# Patient Record
Sex: Male | Born: 1935 | Race: White | Hispanic: No | Marital: Married | Smoking: Former smoker
Health system: Southern US, Community
[De-identification: ages and names within clinical notes are randomized; demographics above are authoritative.]

## PROBLEM LIST (undated history)

## (undated) DIAGNOSIS — F0390 Unspecified dementia without behavioral disturbance: Secondary | ICD-10-CM

## (undated) DIAGNOSIS — T7840XA Allergy, unspecified, initial encounter: Secondary | ICD-10-CM

## (undated) DIAGNOSIS — I509 Heart failure, unspecified: Secondary | ICD-10-CM

## (undated) DIAGNOSIS — N189 Chronic kidney disease, unspecified: Secondary | ICD-10-CM

## (undated) DIAGNOSIS — C801 Malignant (primary) neoplasm, unspecified: Secondary | ICD-10-CM

## (undated) DIAGNOSIS — M539 Dorsopathy, unspecified: Secondary | ICD-10-CM

## (undated) DIAGNOSIS — M2550 Pain in unspecified joint: Secondary | ICD-10-CM

## (undated) HISTORY — DX: Dorsopathy, unspecified: M53.9

## (undated) HISTORY — DX: Heart failure, unspecified: I50.9

## (undated) HISTORY — DX: Chronic kidney disease, unspecified: N18.9

## (undated) HISTORY — DX: Malignant (primary) neoplasm, unspecified: C80.1

## (undated) HISTORY — PX: HERNIA REPAIR: SHX51

## (undated) HISTORY — DX: Allergy, unspecified, initial encounter: T78.40XA

## (undated) HISTORY — DX: Pain in unspecified joint: M25.50

## (undated) HISTORY — PX: BACK SURGERY: SHX140

## (undated) HISTORY — PX: KNEE SURGERY: SHX244

---

## 1997-12-01 ENCOUNTER — Emergency Department (HOSPITAL_COMMUNITY): Admission: EM | Admit: 1997-12-01 | Discharge: 1997-12-01 | Payer: Self-pay | Admitting: Internal Medicine

## 2005-04-22 ENCOUNTER — Emergency Department: Payer: Self-pay | Admitting: Emergency Medicine

## 2005-05-01 ENCOUNTER — Emergency Department: Payer: Self-pay | Admitting: Emergency Medicine

## 2005-08-07 ENCOUNTER — Emergency Department: Payer: Self-pay | Admitting: Emergency Medicine

## 2009-02-12 ENCOUNTER — Ambulatory Visit: Payer: Self-pay | Admitting: Family

## 2009-02-20 ENCOUNTER — Ambulatory Visit: Payer: Self-pay | Admitting: Family

## 2009-05-13 DEATH — deceased

## 2009-05-26 ENCOUNTER — Ambulatory Visit: Payer: Self-pay | Admitting: Family

## 2009-07-07 ENCOUNTER — Ambulatory Visit: Payer: Self-pay | Admitting: Family

## 2009-12-14 ENCOUNTER — Emergency Department: Payer: Self-pay | Admitting: Emergency Medicine

## 2011-06-14 HISTORY — PX: EXTERNAL EAR SURGERY: SHX627

## 2014-06-02 ENCOUNTER — Emergency Department: Payer: Self-pay | Admitting: Emergency Medicine

## 2014-06-18 ENCOUNTER — Ambulatory Visit (INDEPENDENT_AMBULATORY_CARE_PROVIDER_SITE_OTHER): Payer: Medicare Other | Admitting: General Surgery

## 2014-06-18 ENCOUNTER — Encounter: Payer: Self-pay | Admitting: General Surgery

## 2014-06-18 VITALS — BP 120/86 | HR 86 | Resp 16 | Ht 68.0 in | Wt 158.0 lb

## 2014-06-18 DIAGNOSIS — K4090 Unilateral inguinal hernia, without obstruction or gangrene, not specified as recurrent: Secondary | ICD-10-CM

## 2014-06-18 NOTE — Progress Notes (Signed)
Patient ID: Georgiana Spinner, male   DOB: 1936/04/30, 79 y.o.   MRN: [id number]  Chief Complaint  Patient presents with  . Hernia    HPI RADFORD PEASE Sr is a 79 y.o. male  Here today for a evaluation of a right inguinal hernia.Patient noticed a "knot" right inguinal area about mid November. It went away then 06-03-14 he noticed it again and went to the ED. They were able to "massage it back in". He is having some pain in that area especially when he coughs. He has had both inguinal hernias repaired in the past. Bowels move about every 2 days, he uses a stool softener occasionally for constipation. He is here today with his wife and she does state he is having some memory issues that seem to be worse over the past couple of years. He is hard of hearing.  HPI  Past Medical History  Diagnosis Date  . CHF (congestive heart failure)   . Allergy   . Chronic kidney disease   . Back problem   . Joint ache   . 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 Back surgery    . Knee surgery    . Hernia repair      left inguinal x 2 right inguinal x 1  . External ear surgery Left 2013    cancer    No family history on file.  Social History History  Substance Use Topics  . Smoking status: Former Smoker -- 1.00 packs/day for 40 years    Types: Cigarettes    Quit date: 06/13/1973  . Smokeless tobacco: Never Used  . Alcohol Use: No    Allergies  Allergen Reactions  . Aspirin   . Bee Pollen     Bee stings  . Darvon [Propoxyphene] Other (See Comments)    hallucinations  . Epinephrine Other (See Comments)    hallucinate  . Penicillins Swelling  . Sulfa Antibiotics Swelling  . Xylocaine [Lidocaine Hcl] Other (See Comments)    hallucinate    Current Outpatient Prescriptions  Medication Sig Dispense Refill  . cetirizine (ZYRTEC) 10 MG tablet Take 10 mg by mouth daily.    Mariane Baumgarten Calcium (STOOL SOFTENER PO) Take by mouth as  needed.    . fluticasone (FLOVENT HFA) 110 MCG/ACT inhaler Inhale 1 puff into the lungs 2 (two) times daily.    Marland Kitchen ibuprofen (ADVIL,MOTRIN) 200 MG tablet Take 200 mg by mouth every 6 (six) hours as needed.     No current facility-administered medications for this visit.    Review of Systems Review of Systems  Constitutional: Negative.   Respiratory: Negative.   Cardiovascular: Negative.   Gastrointestinal: Positive for constipation.    Blood pressure 120/86, pulse 86, resp. rate 16, height 5\' 8"  (1.727 m), weight 158 lb (71.668 kg).  Physical Exam Physical Exam  Constitutional: He appears well-developed and well-nourished.  Neck: Neck supple.  Cardiovascular: Normal rate, regular rhythm and normal heart sounds.   Pulmonary/Chest: Effort normal and breath sounds normal.  Abdominal: Soft. Normal appearance. There is no tenderness. A hernia is present. Hernia confirmed positive in the right inguinal area.    Buldge right inguinal area  Lymphadenopathy:    He has no cervical adenopathy.  Neurological: He is alert.  Skin: Skin is warm and dry.    Data Reviewed Emergency department notes reports reduction of right inguinal hernia.06/03/2014.  Assessment  Right inguinal hernia with recent episode of transient incarceration.     Plan    Elective hernia repair is appropriate.  Hernia precautions and incarceration were discussed with the patient. If they develop symptoms of an incarcerated hernia, they were encouraged to seek prompt medical attention.  I have recommended repair of the hernia using mesh on an outpatient basis in the near future. The risk of infection was reviewed. The role of prosthetic mesh to minimize the risk of recurrence was reviewed.    Patient's surgery has been scheduled for 07-09-14 at [facility].   PCP: none REF : Mardee Postin PA  Robert Bellow 06/20/2014, 7:10 AM

## 2014-06-18 NOTE — Patient Instructions (Addendum)

## 2014-06-20 ENCOUNTER — Other Ambulatory Visit: Payer: Self-pay | Admitting: General Surgery

## 2014-06-20 DIAGNOSIS — K4090 Unilateral inguinal hernia, without obstruction or gangrene, not specified as recurrent: Secondary | ICD-10-CM | POA: Insufficient documentation

## 2014-06-30 ENCOUNTER — Encounter: Payer: Self-pay | Admitting: General Surgery

## 2014-06-30 ENCOUNTER — Ambulatory Visit: Payer: Self-pay | Admitting: General Surgery

## 2014-06-30 LAB — CBC WITH DIFFERENTIAL/PLATELET
Basophil #: 0 10*3/uL (ref 0.0–0.1)
Basophil %: 0.7 %
EOS ABS: 0.2 10*3/uL (ref 0.0–0.7)
EOS PCT: 4.2 %
HCT: 45.4 % (ref 40.0–52.0)
HGB: 15.2 g/dL (ref 13.0–18.0)
LYMPHS PCT: 24.4 %
Lymphocyte #: 1.4 10*3/uL (ref 1.0–3.6)
MCH: 33.5 pg (ref 26.0–34.0)
MCHC: 33.5 g/dL (ref 32.0–36.0)
MCV: 100 fL (ref 80–100)
Monocyte #: 0.5 x10 3/mm (ref 0.2–1.0)
Monocyte %: 8 %
NEUTROS PCT: 62.7 %
Neutrophil #: 3.5 10*3/uL (ref 1.4–6.5)
PLATELETS: 152 10*3/uL (ref 150–440)
RBC: 4.55 10*6/uL (ref 4.40–5.90)
RDW: 13.6 % (ref 11.5–14.5)
WBC: 5.7 10*3/uL (ref 3.8–10.6)

## 2014-06-30 LAB — BASIC METABOLIC PANEL
Anion Gap: 8 (ref 7–16)
BUN: 17 mg/dL (ref 7–18)
CALCIUM: 8.4 mg/dL — AB (ref 8.5–10.1)
CO2: 28 mmol/L (ref 21–32)
Chloride: 104 mmol/L (ref 98–107)
Creatinine: 1.06 mg/dL (ref 0.60–1.30)
EGFR (African American): >60
EGFR (Non-African Amer.): >60
Glucose: 84 mg/dL (ref 65–99)
Osmolality: 280 (ref 275–301)
Potassium: 3.7 mmol/L (ref 3.5–5.1)
Sodium: 140 mmol/L (ref 136–145)

## 2014-07-09 ENCOUNTER — Ambulatory Visit: Payer: Self-pay | Admitting: General Surgery

## 2014-07-09 DIAGNOSIS — K4091 Unilateral inguinal hernia, without obstruction or gangrene, recurrent: Secondary | ICD-10-CM

## 2014-07-09 HISTORY — PX: HERNIA REPAIR: SHX51

## 2014-07-10 ENCOUNTER — Encounter: Payer: Self-pay | Admitting: General Surgery

## 2014-07-16 ENCOUNTER — Encounter: Payer: Self-pay | Admitting: General Surgery

## 2014-07-16 ENCOUNTER — Ambulatory Visit (INDEPENDENT_AMBULATORY_CARE_PROVIDER_SITE_OTHER): Payer: Self-pay | Admitting: General Surgery

## 2014-07-16 VITALS — BP 128/68 | HR 74 | Resp 14 | Ht 68.0 in | Wt 157.0 lb

## 2014-07-16 DIAGNOSIS — K4090 Unilateral inguinal hernia, without obstruction or gangrene, not specified as recurrent: Secondary | ICD-10-CM

## 2014-07-16 NOTE — Patient Instructions (Signed)
Patient to to return in one month.

## 2014-07-16 NOTE — Progress Notes (Signed)
Patient ID: Jaime Liner Sr., male   DOB: 02-Jul-1935, 78 y.o.   MRN: [id number]  Chief Complaint  Patient presents with  . Routine Post Op    right inguinal hernia    HPI Jaime Villegas. is a 79 y.o. male here today for his post op right inguinal hernia done on 07/09/14.Patient states he is doing well. Patient states when he moves his bowels, he saw blood on the paper not in the bowel. He states this morning he felt light headed, but this resolved. HPI  Past Medical History  Diagnosis Date  . CHF (congestive heart failure)   . Allergy   . Chronic kidney disease   . Back problem   . Joint ache   . 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 Back surgery    . Knee surgery    . External ear surgery Left 2013    cancer  . Hernia repair      left inguinal x 2 right inguinal x 1  . Hernia repair Right 07/09/14    Bard onlay polypropylene mesh for recurrent hernia repair    No family history on file.  Social History History  Substance Use Topics  . Smoking status: Former Smoker -- 1.00 packs/day for 40 years    Types: Cigarettes    Quit date: 06/13/1973  . Smokeless tobacco: Never Used  . Alcohol Use: No    Allergies  Allergen Reactions  . Aspirin   . Bee Pollen     Bee stings  . Darvon [Propoxyphene] Other (See Comments)    hallucinations  . Epinephrine Other (See Comments)    hallucinate  . Penicillins Swelling  . Sulfa Antibiotics Swelling  . Xylocaine [Lidocaine Hcl] Other (See Comments)    hallucinate    Current Outpatient Prescriptions  Medication Sig Dispense Refill  . cetirizine (ZYRTEC) 10 MG tablet Take 10 mg by mouth daily.    Mariane Baumgarten Calcium (STOOL SOFTENER PO) Take by mouth as needed.    . fluticasone (FLOVENT HFA) 110 MCG/ACT inhaler Inhale 1 puff into the lungs 2 (two) times daily.    Marland Kitchen ibuprofen (ADVIL,MOTRIN) 200 MG tablet Take 200 mg by mouth every 6 (six) hours as needed.     No  current facility-administered medications for this visit.    Review of Systems Review of Systems  Constitutional: Negative.   Respiratory: Negative.   Cardiovascular: Negative.   Gastrointestinal: Positive for constipation.    Blood pressure 128/68, pulse 74, resp. rate 14, height 5\' 8"  (1.727 m), weight 157 lb (71.215 kg).  Physical Exam Physical Exam  Constitutional: He is oriented to person, place, and time. He appears well-developed and well-nourished.  Eyes: Conjunctivae are normal. No scleral icterus.  Neck: Neck supple.  Genitourinary:     Lymphadenopathy:    He has no cervical adenopathy.  Neurological: He is alert and oriented to person, place, and time.  Skin: Skin is warm and dry.   .     Assessment    Doing well status post    Plan    Patient to return in one month.  The patient was asked to apply Neosporin ointment to the area of erythema until it resolves.       Jaime Villegas 07/17/2014, 5:11 PM

## 2014-07-17 ENCOUNTER — Encounter: Payer: Self-pay | Admitting: General Surgery

## 2014-08-13 ENCOUNTER — Encounter: Payer: Self-pay | Admitting: General Surgery

## 2014-08-13 ENCOUNTER — Ambulatory Visit (INDEPENDENT_AMBULATORY_CARE_PROVIDER_SITE_OTHER): Payer: Medicare Other | Admitting: General Surgery

## 2014-08-13 VITALS — BP 118/78 | HR 78 | Resp 14 | Ht 68.0 in | Wt 158.0 lb

## 2014-08-13 DIAGNOSIS — K4090 Unilateral inguinal hernia, without obstruction or gangrene, not specified as recurrent: Secondary | ICD-10-CM

## 2014-08-13 NOTE — Patient Instructions (Signed)
The patient is aware to call back for any questions or concerns.  

## 2014-08-13 NOTE — Progress Notes (Signed)
Patient ID: Jaime Liner Sr., male   DOB: 1936-01-23, 79 y.o.   MRN: [id number]  Chief Complaint  Patient presents with  . Routine Post Op    hernia    HPI Jaime PENSE Sr. is a 79 y.o. male.  Here today for postoperative visit, hernia repair 07-08-14. States he is doing well. Using ibuprofen as needed for pain. He is using a stool softener and bowels move daily.  HPI  Past Medical History  Diagnosis Date  . CHF (congestive heart failure)   . Allergy   . Chronic kidney disease   . Back problem   . Joint ache   . 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 Back surgery    . Knee surgery    . External ear surgery Left 2013    cancer  . Hernia repair      left inguinal x 2 right inguinal x 1  . Hernia repair Right 07/09/14    Bard onlay polypropylene mesh for recurrent hernia repair    No family history on file.  Social History History  Substance Use Topics  . Smoking status: Former Smoker -- 1.00 packs/day for 40 years    Types: Cigarettes    Quit date: 06/13/1973  . Smokeless tobacco: Never Used  . Alcohol Use: No    Allergies  Allergen Reactions  . Aspirin   . Bee Pollen     Bee stings  . Darvon [Propoxyphene] Other (See Comments)    hallucinations  . Epinephrine Other (See Comments)    hallucinate  . Penicillins Swelling  . Sulfa Antibiotics Swelling  . Xylocaine [Lidocaine Hcl] Other (See Comments)    hallucinate    Current Outpatient Prescriptions  Medication Sig Dispense Refill  . cetirizine (ZYRTEC) 10 MG tablet Take 10 mg by mouth daily.    Jaime Villegas Calcium (STOOL SOFTENER PO) Take by mouth as needed.    . fluticasone (FLOVENT HFA) 110 MCG/ACT inhaler Inhale 1 puff into the lungs 2 (two) times daily.    Marland Kitchen ibuprofen (ADVIL,MOTRIN) 200 MG tablet Take 200 mg by mouth every 6 (six) hours as needed.     No current facility-administered medications for this visit.    Review of Systems Review  of Systems  Constitutional: Negative.   Respiratory: Negative.   Cardiovascular: Negative.     Blood pressure 118/78, pulse 78, resp. rate 14, height 5\' 8"  (1.727 m), weight 158 lb (71.668 kg).  Physical Exam Physical Exam  Constitutional: He is oriented to person, place, and time. He appears well-developed and well-nourished.  Abdominal:    Genitourinary:  Incision healing well.  Neurological: He is alert and oriented to person, place, and time.  Skin: Skin is warm and dry.       Assessment    Doing well status post right inguinal hernia repair.    Plan    The patient was encouraged to use good judgment with strenuous physical activity. Proper lifting technique was described and demonstrated.    Follow up as needed.  PCP:  No PCP REF : Jaime Postin PA   Robert Bellow 08/14/2014, 8:26 PM

## 2014-10-12 NOTE — Op Note (Signed)
PATIENT NAME:  Jaime Villegas, Jaime Villegas MR#:  [id number] DATE OF BIRTH:  [birth year]  DATE OF PROCEDURE:  07/09/2014  PREOPERATIVE DIAGNOSIS:  Recurrent right inguinal hernia.   POSTOPERATIVE DIAGNOSIS:  Recurrent right inguinal hernia.  OPERATIVE PROCEDURE:  Repair of recurrent right indirect inguinal hernia with Bard polypropylene mesh.   SURGEON:  Robert Bellow, MD  ANESTHESIA:  General by LMA under Gjibertus F. Boston Service, MD, Marcaine 0.5% plain with 30 mL local infiltration.   ESTIMATED BLOOD LOSS:  Less than 5 mL.   CLINICAL NOTE:  This 79 year old male underwent an inguinal hernia repair in the distant past. He has developed a recurrence. He presented to the Emergency Department last month with an episode of incarceration that was reduced by the Emergency Room staff. He is brought to the operating room at this time for planned elective repair.   The patient received Kefzol prior to the procedure. Hair was clipped from the surgical site prior to being brought to the operating room.   OPERATIVE NOTE:  With the patient under adequate general anesthesia, the abdomen was prepped with ChloraPrep and draped. A 5 cm incision along the anticipated course of the inguinal canal was made in the right lower quadrant. Hemostasis was with electrocautery. The external oblique was opened in the direction of its fibers. Moderate scarring from previous surgery was noted. The cord was identified and mobilized. There was an indirect sac that was scarred. This was dissected at the level of the internal inguinal ring. The sac was opened and found to be patent with gentle probing. The surprising portion of this discovery was that the sac was essentially superior and lateral to the cord rather than superior and medial. The entire cord was dissected and no additional hernia sacs were appreciated. A lipoma of the cord was excised and discarded. The floor of the inguinal canal was examined and no medial weakness  appreciated. The hernia sac was transfixed with a 3-0 Vicryl suture ligature followed by a 3-0 Vicryl tie, excised, and discarded. This was done under direct visualization. The cord was again dissected and no additional defects noted. A Bard polypropylene lightweight mesh was brought into the field and anchored to the pubic tubercle with 0 Surgilon. It was then anchored to the inguinal ligament with interrupted 0 Surgilon sutures. The lateral slit was closed with 0 Surgilon sutures. The medial and superior borders were anchored to the transverse abdominis aponeurosis. Care was taken to avoid the ilioinguinal and iliohypogastric nerves. The external oblique was closed with a running 2-0 Vicryl. Scarpa fascia was closed with a running 3-0 Vicryl and the skin closed with a running 4-0 Vicryl subcuticular suture. Benzoin, Steri-Strips, Telfa, and Tegaderm dressing was then applied.   The patient tolerated the procedure well.    ____________________________ Robert Bellow, MD jwb:nb D: 07/09/2014 14:39:50 ET T: 07/09/2014 22:03:06 ET JOB#: [id number]  cc: Robert Bellow, MD, <Dictator> JEFFREY Amedeo Kinsman MD ELECTRONICALLY SIGNED 07/10/2014 8:26

## 2018-09-24 ENCOUNTER — Emergency Department
Admission: EM | Admit: 2018-09-24 | Discharge: 2018-09-25 | Disposition: A | Discharge status: Home / Self Care | Payer: Medicare Other | Attending: Student in an Organized Health Care Education/Training Program | Admitting: Student in an Organized Health Care Education/Training Program

## 2018-09-24 ENCOUNTER — Other Ambulatory Visit: Payer: Self-pay

## 2018-09-24 ENCOUNTER — Emergency Department: Payer: Medicare Other

## 2018-09-24 ENCOUNTER — Encounter: Payer: Self-pay | Admitting: Emergency Medicine

## 2018-09-24 DIAGNOSIS — Z87891 Personal history of nicotine dependence: Secondary | ICD-10-CM | POA: Diagnosis not present

## 2018-09-24 DIAGNOSIS — Z7982 Long term (current) use of aspirin: Secondary | ICD-10-CM | POA: Insufficient documentation

## 2018-09-24 DIAGNOSIS — F0390 Unspecified dementia without behavioral disturbance: Secondary | ICD-10-CM | POA: Diagnosis not present

## 2018-09-24 DIAGNOSIS — I130 Hypertensive heart and chronic kidney disease with heart failure and stage 1 through stage 4 chronic kidney disease, or unspecified chronic kidney disease: Secondary | ICD-10-CM | POA: Diagnosis not present

## 2018-09-24 DIAGNOSIS — F0280 Dementia in other diseases classified elsewhere without behavioral disturbance: Secondary | ICD-10-CM | POA: Insufficient documentation

## 2018-09-24 DIAGNOSIS — G301 Alzheimer's disease with late onset: Secondary | ICD-10-CM | POA: Diagnosis not present

## 2018-09-24 DIAGNOSIS — I509 Heart failure, unspecified: Secondary | ICD-10-CM | POA: Diagnosis not present

## 2018-09-24 DIAGNOSIS — G309 Alzheimer's disease, unspecified: Secondary | ICD-10-CM | POA: Diagnosis not present

## 2018-09-24 DIAGNOSIS — R4182 Altered mental status, unspecified: Secondary | ICD-10-CM | POA: Diagnosis present

## 2018-09-24 DIAGNOSIS — Z79899 Other long term (current) drug therapy: Secondary | ICD-10-CM | POA: Insufficient documentation

## 2018-09-24 DIAGNOSIS — N189 Chronic kidney disease, unspecified: Secondary | ICD-10-CM | POA: Diagnosis not present

## 2018-09-24 DIAGNOSIS — F0391 Unspecified dementia with behavioral disturbance: Secondary | ICD-10-CM

## 2018-09-24 HISTORY — DX: Unspecified dementia, unspecified severity, without behavioral disturbance, psychotic disturbance, mood disturbance, and anxiety: F03.90

## 2018-09-24 LAB — COMPREHENSIVE METABOLIC PANEL
ALT: 10 U/L (ref 0–44)
AST: 22 U/L (ref 15–41)
Albumin: 3.9 g/dL (ref 3.5–5.0)
Alkaline Phosphatase: 31 U/L — ABNORMAL LOW (ref 38–126)
Anion gap: 13 (ref 5–15)
BUN: 18 mg/dL (ref 8–23)
CO2: 27 mmol/L (ref 22–32)
Calcium: 9 mg/dL (ref 8.9–10.3)
Chloride: 100 mmol/L (ref 98–111)
Creatinine, Ser: 1.1 mg/dL (ref 0.61–1.24)
GFR calc Af Amer: >60 mL/min (ref >60)
GFR calc non Af Amer: >60 mL/min (ref >60)
Glucose, Bld: 96 mg/dL (ref 70–99)
Potassium: 3.5 mmol/L (ref 3.5–5.1)
Sodium: 140 mmol/L (ref 135–145)
Total Bilirubin: 1.2 mg/dL (ref 0.3–1.2)
Total Protein: 7.1 g/dL (ref 6.5–8.1)

## 2018-09-24 LAB — CBC
HCT: 40.7 % (ref 39.0–52.0)
Hemoglobin: 14 g/dL (ref 13.0–17.0)
MCH: 32.3 pg (ref 26.0–34.0)
MCHC: 34.4 g/dL (ref 30.0–36.0)
MCV: 93.8 fL (ref 80.0–100.0)
Platelets: 154 10*3/uL (ref 150–400)
RBC: 4.34 MIL/uL (ref 4.22–5.81)
RDW: 12.1 % (ref 11.5–15.5)
WBC: 5.2 10*3/uL (ref 4.0–10.5)
nRBC: 0 % (ref 0.0–0.2)

## 2018-09-24 LAB — TROPONIN I: Troponin I: <0.03 ng/mL (ref <0.03)

## 2018-09-24 MED ORDER — GABAPENTIN 100 MG PO CAPS
100.0000 mg | ORAL_CAPSULE | ORAL | Status: DC
  Filled 2018-09-24: qty 1

## 2018-09-24 MED ORDER — DIVALPROEX SODIUM 125 MG PO DR TAB
125.0000 mg | DELAYED_RELEASE_TABLET | Freq: Two times a day (BID) | ORAL | Status: DC
  Administered 2018-09-24 – 2018-09-25 (×2): 125 mg via ORAL
  Filled 2018-09-24 (×4): qty 1

## 2018-09-24 MED ORDER — TRAMADOL HCL 50 MG PO TABS: 50.0000 mg | ORAL_TABLET | Freq: Two times a day (BID) | ORAL | Status: DC

## 2018-09-24 MED ORDER — LORAZEPAM 1 MG PO TABS
1.0000 mg | ORAL_TABLET | Freq: Once | ORAL | Status: AC
  Administered 2018-09-24: 22:00:00 1 mg via ORAL
  Filled 2018-09-24: qty 1

## 2018-09-24 MED ORDER — ZIPRASIDONE MESYLATE 20 MG IM SOLR
20.0000 mg | Freq: Once | INTRAMUSCULAR | Status: AC
  Administered 2018-09-24: 20 mg via INTRAMUSCULAR
  Filled 2018-09-24: qty 20

## 2018-09-24 MED ORDER — MEMANTINE HCL 5 MG PO TABS
10.0000 mg | ORAL_TABLET | Freq: Two times a day (BID) | ORAL | Status: DC
  Administered 2018-09-24 – 2018-09-25 (×2): 10 mg via ORAL
  Filled 2018-09-24 (×2): qty 2

## 2018-09-24 MED ORDER — GABAPENTIN 400 MG PO CAPS
400.0000 mg | ORAL_CAPSULE | Freq: Every day | ORAL | Status: DC
  Administered 2018-09-24: 22:00:00 400 mg via ORAL
  Filled 2018-09-24 (×2): qty 1

## 2018-09-24 MED ORDER — ASPIRIN EC 81 MG PO TBEC
81.0000 mg | DELAYED_RELEASE_TABLET | ORAL | Status: DC
  Administered 2018-09-24: 81 mg via ORAL
  Filled 2018-09-24: qty 1

## 2018-09-24 MED ORDER — MIRTAZAPINE 15 MG PO TABS
15.0000 mg | ORAL_TABLET | Freq: Every day | ORAL | Status: DC
  Administered 2018-09-24: 22:00:00 15 mg via ORAL
  Filled 2018-09-24: qty 1

## 2018-09-24 MED ORDER — SODIUM CHLORIDE 0.9% FLUSH
3.0000 mL | Freq: Once | INTRAVENOUS | Status: AC
  Administered 2018-09-24: 18:00:00 3 mL via INTRAVENOUS

## 2018-09-24 MED ORDER — SODIUM CHLORIDE 0.9 % IV BOLUS
500.0000 mL | Freq: Once | INTRAVENOUS | Status: AC
  Administered 2018-09-24: 19:00:00 500 mL via INTRAVENOUS

## 2018-09-24 NOTE — ED Notes (Signed)
Moved onto hospital bed with alarm

## 2018-09-24 NOTE — ED Notes (Signed)
Patient transported to CT 

## 2018-09-24 NOTE — ED Provider Notes (Addendum)
[facility] Emergency Department Provider Note    First MD Initiated Contact with Patient 09/24/18 1750     (approximate)  I have reviewed the triage vital signs and the nursing notes.   HISTORY  Chief Complaint Altered Mental Status and Failure To Thrive  Level V Caveat:  dementia  HPI Jaime MCGANN Sr. is a 83 y.o. male presents the ER via family after being dropped off because "we cannot take care of him.  ".  Patient refusing to eat or drink or take medications for only 24 hours.  Patient intermittently yelling out "time to go ".  Denies any pain.  No nausea or vomiting.  He is pleasant no acute distress.    Past Medical History:  Diagnosis Date  . Allergy   . Back problem   . Cancer ([place]) 2006, 2013   nose, left ear  . CHF (congestive heart failure) ([place])   . Chronic kidney disease   . Dementia ([place])   . Joint ache    No family history on file. Past Surgical History:  Procedure Laterality Date  . BACK SURGERY    . EXTERNAL EAR SURGERY Left 2013   cancer  . HERNIA REPAIR     left inguinal x 2 right inguinal x 1  . HERNIA REPAIR Right 07/09/14   Bard onlay polypropylene mesh for recurrent hernia repair  . KNEE SURGERY     Patient Active Problem List   Diagnosis Date Noted  . Right inguinal hernia 06/20/2014      Prior to Admission medications   Medication Sig Start Date End Date Taking? Authorizing Provider  aspirin EC 81 MG tablet Take 81 mg by mouth 2 (two) times a week.   Yes [provider]  cetirizine (ZYRTEC) 10 MG tablet Take 10 mg by mouth daily.   Yes [provider]  divalproex (DEPAKOTE) 125 MG DR tablet Take 125 mg by mouth 2 (two) times daily. 09/18/18  Yes [provider]  gabapentin (NEURONTIN) 100 MG capsule Take 100 mg by mouth every morning. 08/06/18  Yes [provider]  gabapentin (NEURONTIN) 400 MG capsule Take 400 mg by mouth Nightly. 08/06/18  Yes [provider]   mirtazapine (REMERON) 15 MG tablet Take 15 mg by mouth at bedtime. 09/18/18  Yes [provider]  vitamin B-12 (CYANOCOBALAMIN) 1000 MCG tablet Take 1,000 mcg by mouth daily.   Yes [provider]  Docusate Calcium (STOOL SOFTENER PO) Take by mouth as needed.    [provider]  fluticasone (FLOVENT HFA) 110 MCG/ACT inhaler Inhale 1 puff into the lungs 2 (two) times daily.    [provider]  ibuprofen (ADVIL,MOTRIN) 200 MG tablet Take 200 mg by mouth every 6 (six) hours as needed.    [provider]  memantine (NAMENDA) 10 MG tablet Take 10 mg by mouth 2 (two) times daily. 07/31/18   [provider]  traMADol (ULTRAM) 50 MG tablet Take 50 mg by mouth 2 (two) times daily. 08/06/18   [provider]    Allergies Aspirin; Bee pollen; Darvon [propoxyphene]; Epinephrine; Penicillins; Sulfa antibiotics; and Xylocaine [lidocaine hcl]    Social History Social History   Tobacco Use  . Smoking status: Former Smoker    Packs/day: 1.00    Years: 40.00    Pack years: 40.00    Types: Cigarettes    Last attempt to quit: 06/13/1973    Years since quitting: 45.3  . Smokeless tobacco: Never  Used  Substance Use Topics  . Alcohol use: No    Alcohol/week: 0.0 standard drinks  . Drug use: No    Review of Systems Patient denies headaches, rhinorrhea, blurry vision, numbness, shortness of breath, chest pain, edema, cough, abdominal pain, nausea, vomiting, diarrhea, dysuria, fevers, rashes or hallucinations unless otherwise stated above in HPI. ____________________________________________   PHYSICAL EXAM:  VITAL SIGNS: Vitals:   09/24/18 1740  BP: 120/88  Pulse: (!) 103  Resp: 16  Temp: 97.6 F (36.4 C)  SpO2: 100%    Constitutional: Alert pleasant in no acute distress. Eyes: Conjunctivae are normal.  Head: Atraumatic. Nose: No congestion/rhinnorhea. Mouth/Throat: Mucous membranes are moist.   Neck: No stridor. Painless ROM.   Cardiovascular: Normal rate, regular rhythm. Grossly normal heart sounds.  Good peripheral circulation. Respiratory: Normal respiratory effort.  No retractions. Lungs CTAB. Gastrointestinal: Soft and nontender. No distention. No abdominal bruits. No CVA tenderness. Genitourinary:  Musculoskeletal: No lower extremity tenderness nor edema.  No joint effusions. Neurologic:   No gross focal neurologic deficits are appreciated. No facial droop Skin:  Skin is warm, dry and intact. No rash noted. Psychiatric: pleasant and calm but will intermittently yell out "time to go" ____________________________________________   LABS (all labs ordered are listed, but only abnormal results are displayed)  Results for orders placed or performed during the hospital encounter of 09/24/18 (from the past 24 hour(s))  Comprehensive metabolic panel     Status: Abnormal   Collection Time: 09/24/18  5:58 PM  Result Value Ref Range   Sodium 140 135 - 145 mmol/L   Potassium 3.5 3.5 - 5.1 mmol/L   Chloride 100 98 - 111 mmol/L   CO2 27 22 - 32 mmol/L   Glucose, Bld 96 70 - 99 mg/dL   BUN 18 8 - 23 mg/dL   Creatinine, Ser 1.10 0.61 - 1.24 mg/dL   Calcium 9.0 8.9 - 10.3 mg/dL   Total Protein 7.1 6.5 - 8.1 g/dL   Albumin 3.9 3.5 - 5.0 g/dL   AST 22 15 - 41 U/L   ALT 10 0 - 44 U/L   Alkaline Phosphatase 31 (L) 38 - 126 U/L   Total Bilirubin 1.2 0.3 - 1.2 mg/dL   GFR calc non Af Amer >60 >60 mL/min   GFR calc Af Amer >60 >60 mL/min   Anion gap 13 5 - 15  CBC     Status: None   Collection Time: 09/24/18  5:58 PM  Result Value Ref Range   WBC 5.2 4.0 - 10.5 K/uL   RBC 4.34 4.22 - 5.81 MIL/uL   Hemoglobin 14.0 13.0 - 17.0 g/dL   HCT 40.7 39.0 - 52.0 %   MCV 93.8 80.0 - 100.0 fL   MCH 32.3 26.0 - 34.0 pg   MCHC 34.4 30.0 - 36.0 g/dL   RDW 12.1 11.5 - 15.5 %   Platelets 154 150 - 400 K/uL   nRBC 0.0 0.0 - 0.2 %  Troponin I - ONCE - STAT     Status: None   Collection Time: 09/24/18  5:58 PM  Result Value Ref  Range   Troponin I <0.03 <0.03 ng/mL   ____________________________________________  EKG My review and personal interpretation at Time: 17:51   Indication: dementia  Rate: 90  Rhythm: sinus Axis: normal Other: normal intervals, frequent pac,  ____________________________________________  RADIOLOGY  I personally reviewed all radiographic images ordered to evaluate for the above acute complaints and reviewed radiology reports and findings.  These  findings were personally discussed with the patient.  Please see medical record for radiology report.  ____________________________________________   PROCEDURES  Procedure(s) performed:  Procedures    Critical Care performed: no ____________________________________________   INITIAL IMPRESSION / ASSESSMENT AND PLAN / ED COURSE  Pertinent labs & imaging results that were available during my care of the patient were reviewed by me and considered in my medical decision making (see chart for details).   DDX: Dehydration, dementia, sepsis, pna, uti, hypoglycemia, cva, drug effect, withdrawal,  Jaime LANDSTROM Sr. is a 83 y.o. who presents to the ED with symptoms as described above with advanced dementia dropped off at the ER by family they feel that they are unable to care for him any longer.  Patient well-appearing hemodynamically stable.  Blood work reassuring.  Will consult social work as well as psychiatry.     The patient was evaluated in Emergency Department today for the symptoms described in the history of present illness. He/she was evaluated in the context of the global COVID-19 pandemic, which necessitated consideration that the patient might be at risk for infection with the SARS-CoV-2 virus that causes COVID-19. Institutional protocols and algorithms that pertain to the evaluation of patients at risk for COVID-19 are in a state of rapid change based on information released by regulatory bodies including the CDC and federal and  state organizations. These policies and algorithms were followed during the patient's care in the ED.  As part of my medical decision making, I reviewed the following data within the [place] notes reviewed and incorporated, Labs reviewed, notes from prior ED visits and [place] Controlled Substance Database   ____________________________________________   FINAL CLINICAL IMPRESSION(S) / ED DIAGNOSES  Final diagnoses:  Dementia without behavioral disturbance, unspecified dementia type ([place])      NEW MEDICATIONS STARTED DURING THIS VISIT:  New Prescriptions   No medications on file     Note:  This document was prepared using Dragon voice recognition software and may include unintentional dictation errors.    Merlyn Lot, MD 09/24/18 Dorthula Perfect    Merlyn Lot, MD 09/24/18 [phone]

## 2018-09-24 NOTE — ED Triage Notes (Signed)
Patient's wife brought patient because patient has been refusing to eat since yesterday and has been refusing his medications yesterday and today.  Patient's wife states, "He has end stage dementia and we just can't take care of him at home anymore."

## 2018-09-24 NOTE — ED Notes (Signed)
Pt attempting to get out of bed x 2. 1st time to void and pt assisted to BR and did void. 2nd time to go home and I explained his wife had something to do and he would stay here tonight. He said ok and slid back up in bed. Room changed to #9 for better observation from nursing station. Bed alarm in place and worked x 2 to alert staff he was getting up

## 2018-09-24 NOTE — Consult Note (Signed)
[facility] Consult   Reason for Consult: Altered mental status and failure to thrive Referring Physician: Dr. Quentin Cornwall Patient Identification: Jaime Liner Sr. MRN:  [id number] Principal Diagnosis: <principal problem not specified> Diagnosis:  Active Problems:   Dementia ([place])   Total Time spent with patient: 1 hour  Subjective: "Time to go"  Jaime Liner Sr. is a 83 y.o. male patient presented to [facility] ED by his family voluntarily. The patient was seen face-to-face by this provider; chart reviewed and consulted with Dr.  Quentin Cornwall on 09/24/2018 due to the care of the patient. It was discussed with the provider that the patient does not meet criteria to be admitted to the inpatient psychiatric unit. Discussion continue by agreeing that the patient does need long-term care in a skilled nursing facility and social work consult will be placed. On evaluation the patient is alert, calm and cooperative, but unable to answer questions presented to him.  The only statement the patient makes is "time to go" and nothing else during his assessment. The patient does appear to be responding to internal stimuli. He is presenting with  delusional thinking. The patient is also presenting with some hallucinating behaviors.  Collateral was obtained by the patient's wife (Ms. Cheyenne Schumm [phone] or [phone]) who expresses concerns for the worsening of her husband dementia diagnosis.  Ms. Kope stated, "I cannot take care of him anymore."   She stated in an emotional voice "I knew that his disease was going to get worse, but I did not think it was going to be this fast."  She expressed that the patient saw his neurologist in January and he did discussed the progression of his dementia diagnosis.  She stated, that his doctor said the disease could get to the point where the patient becomes violent.  Ms. Bevens voice it has not happened yet.  She discussed, that he has begun  wandering.  She voiced that he was brought to the hospital due to him refusing to take his medication along with not eating.  She said that over the past 24 hours he would spit out his medication and refused to eat anything.  She continues to voice that "I have tried everything.  I just do not want your to think I am giving up on him."  "I know your can help me find somewhere that they are able to take care of him." This provider discussed with the patient's wife that social work consult will be ordered and someone from the social work team will contact her in the next few days.  The patient wife was in agreement and voiced the help would be of great value to her and her family.  She continued to express how grateful she is to get the assistance she needs.  Plan: the patient is a safety risk to himself due to the progression of his dementia diagnosis.  The patient currently is in need of an inpatient nursing facility in order to assist with stabilization and treatment in his care.  HPI: Per Dr. Quentin Cornwall: Jaime Liner Sr. is a 83 y.o. male presents the ER via family after being dropped off because "we cannot take care of him.  ".  Patient refusing to eat or drink or take medications for only 24 hours.  Patient intermittently yelling out "time to go ".  Denies any pain.  No nausea or vomiting.  He is pleasant no acute distress.  Past Psychiatric History:  Dementia  Risk to Self:  Yes Risk to Others:  Yes Prior Inpatient Therapy:  No Prior Outpatient Therapy:  No  Past Medical History:  Past Medical History:  Diagnosis Date  . Allergy   . Back problem   . Cancer ([place]) 2006, 2013   nose, left ear  . CHF (congestive heart failure) ([place])   . Chronic kidney disease   . Dementia ([place])   . Joint ache     Past Surgical History:  Procedure Laterality Date  . BACK SURGERY    . EXTERNAL EAR SURGERY Left 2013   cancer  . HERNIA REPAIR     left inguinal x 2 right inguinal x 1  . HERNIA REPAIR  Right 07/09/14   Bard onlay polypropylene mesh for recurrent hernia repair  . KNEE SURGERY     Family History: No family history on file. Family Psychiatric  History:  Social History:  Social History   Substance and Sexual Activity  Alcohol Use No  . Alcohol/week: 0.0 standard drinks     Social History   Substance and Sexual Activity  Drug Use No    Social History   Socioeconomic History  . Marital status: Married    Spouse name: Not on file  . Number of children: Not on file  . Years of education: Not on file  . Highest education level: Not on file  Occupational History  . Not on file  Social Needs  . Financial resource strain: Not on file  . Food insecurity:    Worry: Not on file    Inability: Not on file  . Transportation needs:    Medical: Not on file    Non-medical: Not on file  Tobacco Use  . Smoking status: Former Smoker    Packs/day: 1.00    Years: 40.00    Pack years: 40.00    Types: Cigarettes    Last attempt to quit: 06/13/1973    Years since quitting: 45.3  . Smokeless tobacco: Never Used  Substance and Sexual Activity  . Alcohol use: No    Alcohol/week: 0.0 standard drinks  . Drug use: No  . Sexual activity: Not on file  Lifestyle  . Physical activity:    Days per week: Not on file    Minutes per session: Not on file  . Stress: Not on file  Relationships  . Social connections:    Talks on phone: Not on file    Gets together: Not on file    Attends religious service: Not on file    Active member of club or organization: Not on file    Attends meetings of clubs or organizations: Not on file    Relationship status: Not on file  Other Topics Concern  . Not on file  Social History Narrative  . Not on file   Additional Social History:    Allergies:   Allergies  Allergen Reactions  . Aspirin   . Bee Pollen     Bee stings  . Darvon [Propoxyphene] Other (See Comments)    hallucinations  . Epinephrine Other (See Comments)    hallucinate   . Penicillins Swelling  . Sulfa Antibiotics Swelling  . Xylocaine [Lidocaine Hcl] Other (See Comments)    hallucinate    Labs:  Results for orders placed or performed during the hospital encounter of 09/24/18 (from the past 48 hour(s))  Comprehensive metabolic panel     Status: Abnormal   Collection Time: 09/24/18  5:58 PM  Result Value Ref  Range   Sodium 140 135 - 145 mmol/L   Potassium 3.5 3.5 - 5.1 mmol/L   Chloride 100 98 - 111 mmol/L   CO2 27 22 - 32 mmol/L   Glucose, Bld 96 70 - 99 mg/dL   BUN 18 8 - 23 mg/dL   Creatinine, Ser 1.10 0.61 - 1.24 mg/dL   Calcium 9.0 8.9 - 10.3 mg/dL   Total Protein 7.1 6.5 - 8.1 g/dL   Albumin 3.9 3.5 - 5.0 g/dL   AST 22 15 - 41 U/L   ALT 10 0 - 44 U/L   Alkaline Phosphatase 31 (L) 38 - 126 U/L   Total Bilirubin 1.2 0.3 - 1.2 mg/dL   GFR calc non Af Amer >60 >60 mL/min   GFR calc Af Amer >60 >60 mL/min   Anion gap 13 5 - 15    Comment: Performed at [facility], [address], [place] [address]  CBC     Status: None   Collection Time: 09/24/18  5:58 PM  Result Value Ref Range   WBC 5.2 4.0 - 10.5 K/uL   RBC 4.34 4.22 - 5.81 MIL/uL   Hemoglobin 14.0 13.0 - 17.0 g/dL   HCT 40.7 39.0 - 52.0 %   MCV 93.8 80.0 - 100.0 fL   MCH 32.3 26.0 - 34.0 pg   MCHC 34.4 30.0 - 36.0 g/dL   RDW 12.1 11.5 - 15.5 %   Platelets 154 150 - 400 K/uL   nRBC 0.0 0.0 - 0.2 %    Comment: Performed at [facility], [address] [place], [place] [address]  Troponin I - ONCE - STAT     Status: None   Collection Time: 09/24/18  5:58 PM  Result Value Ref Range   Troponin I <0.03 <0.03 ng/mL    Comment: Performed at [facility], [address] [place], [place] [address]    Current Facility-Administered Medications  Medication Dose Route Frequency Provider Last Rate Last Dose  . aspirin EC tablet 81 mg  81 mg Oral Once per day on Mon Thu Robinson, Patrick, MD      . divalproex (DEPAKOTE) DR tablet 125 mg  125 mg Oral  BID Merlyn Lot, MD      . Derrill Memo ON 09/25/2018] gabapentin (NEURONTIN) capsule 100 mg  100 mg Oral Particia Nearing, MD      . gabapentin (NEURONTIN) capsule 400 mg  400 mg Oral Nightly Merlyn Lot, MD      . LORazepam (ATIVAN) tablet 1 mg  1 mg Oral Once Merlyn Lot, MD      . memantine [facility]) tablet 10 mg  10 mg Oral BID Merlyn Lot, MD      . mirtazapine (REMERON) tablet 15 mg  15 mg Oral QHS Merlyn Lot, MD       Current Outpatient Medications  Medication Sig Dispense Refill  . aspirin EC 81 MG tablet Take 81 mg by mouth 2 (two) times a week.    . cetirizine (ZYRTEC) 10 MG tablet Take 10 mg by mouth daily.    . divalproex (DEPAKOTE) 125 MG DR tablet Take 125 mg by mouth 2 (two) times daily.    Marland Kitchen gabapentin (NEURONTIN) 100 MG capsule Take 100 mg by mouth every morning.    . gabapentin (NEURONTIN) 400 MG capsule Take 400 mg by mouth Nightly.    . mirtazapine (REMERON) 15 MG tablet Take 15 mg by mouth at bedtime.    . vitamin  B-12 (CYANOCOBALAMIN) 1000 MCG tablet Take 1,000 mcg by mouth daily.    Mariane Baumgarten Calcium (STOOL SOFTENER PO) Take by mouth as needed.    . fluticasone (FLOVENT HFA) 110 MCG/ACT inhaler Inhale 1 puff into the lungs 2 (two) times daily.    Marland Kitchen ibuprofen (ADVIL,MOTRIN) 200 MG tablet Take 200 mg by mouth every 6 (six) hours as needed.    . memantine (NAMENDA) 10 MG tablet Take 10 mg by mouth 2 (two) times daily.    . traMADol (ULTRAM) 50 MG tablet Take 50 mg by mouth 2 (two) times daily.      Musculoskeletal: Strength & Muscle Tone: decreased Gait & Station: unsteady Patient leans: Backward  Psychiatric Specialty Exam: Physical Exam  Nursing note and vitals reviewed. Constitutional: He appears well-developed and well-nourished.  HENT:  Head: Normocephalic and atraumatic.  Eyes: Pupils are equal, round, and reactive to light. Conjunctivae and EOM are normal.  Neck: Normal range of motion. Neck supple.  Cardiovascular:  Normal rate and regular rhythm.  Respiratory: Effort normal.  Musculoskeletal: Normal range of motion.  Neurological: He is alert.  Skin: Skin is warm and dry.  Psychiatric: He has a normal mood and affect.    Review of Systems  Neurological: Positive for loss of consciousness.  Psychiatric/Behavioral: Positive for hallucinations and memory loss. Negative for depression, substance abuse and suicidal ideas. The patient is nervous/anxious and has insomnia.   All other systems reviewed and are negative.   Blood pressure 120/88, pulse (!) 103, temperature 97.6 F (36.4 C), temperature source Oral, resp. rate 16, SpO2 100 %.There is no height or weight on file to calculate BMI.  General Appearance: Disheveled  Eye Contact:  Poor  Speech:  Blocked  Volume:  Increased  Mood:  NA and Irritable  Affect:  Inappropriate  Thought Process:  Disorganized  Orientation:  Negative  Thought Content:  Hallucinations: Auditory  Suicidal Thoughts:  No  Homicidal Thoughts:  No  Memory:  Immediate;   Dementia Recent;   NA  Judgement:  Impaired  Insight:  Lacking  Psychomotor Activity:  Decreased  Concentration:  Concentration: Poor  Recall:  Poor  Fund of Knowledge:  Poor  Language:  Poor  Akathisia:  NA  Handed:  Right  AIMS (if indicated):     Assets:  Housing Social Support  ADL's:  Intact  Cognition:  Impaired,  Severe  Sleep:   Insomnia     Treatment Plan Summary: Daily contact with patient to assess and evaluate symptoms and progress in treatment, Medication management and Plan Skilled nursing facility   Disposition: No evidence of imminent risk to self or others at present.   Patient does not meet criteria for psychiatric inpatient admission.  The patient currently is in need off and inpatient nursing facility in order to assist with stabilization and treatment in his care.   Lamont Dowdy, NP 09/24/2018 9:12 PM

## 2018-09-24 NOTE — ED Triage Notes (Signed)
PT to ED dropped off by wife, states pt has end stage dementia, spitting out his pills and not eating, per family unable to care for pt any longer at home. Pt in NAD, VSS . PT A&O to self

## 2018-09-25 DIAGNOSIS — F0390 Unspecified dementia without behavioral disturbance: Secondary | ICD-10-CM

## 2018-09-25 DIAGNOSIS — G309 Alzheimer's disease, unspecified: Secondary | ICD-10-CM

## 2018-09-25 LAB — GLUCOSE, CAPILLARY: Glucose-Capillary: 81 mg/dL (ref 70–99)

## 2018-09-25 MED ORDER — HALOPERIDOL LACTATE 5 MG/ML IJ SOLN
2.0000 mg | Freq: Once | INTRAMUSCULAR | Status: AC
  Administered 2018-09-25: 2 mg via INTRAVENOUS
  Filled 2018-09-25: qty 1

## 2018-09-25 MED ORDER — LORAZEPAM 2 MG/ML IJ SOLN
1.0000 mg | Freq: Once | INTRAMUSCULAR | Status: AC
  Administered 2018-09-25: 15:00:00 1 mg via INTRAVENOUS
  Filled 2018-09-25: qty 1

## 2018-09-25 NOTE — Consult Note (Signed)
[facility] Face-to-Face Psychiatry Consult   Reason for Consult: Confusion and agitation. Referring Physician: Emergency room physician Patient Identification: Jaime Liner Sr. MRN:  [id number] Principal Diagnosis: <principal problem not specified> Diagnosis:  Active Problems:   Dementia (Jaime Villegas)   Total Time spent with patient: 15 minutes  Subjective:   Jaime Liner Sr. is a 83 y.o. male patient admitted with dementia.  HPI: Patient is seen and examined.  Patient is an 83 year old male with a past psychiatric history significant for late onset Alzheimer's disease who presented to the [facility] emergency department on 09/24/2018.  The patient was brought by his wife because she was unable to care for the patient.  She stated at that time that he had been refusing to eat, and refusing his medications.  She stated that she felt as though his dementia was terrible, and that she was unable to care for him any longer.  He was seen by the nurse practitioner on 10/01/2018.  It was felt secondary to his dementia that he was not eligible for admission to the psychiatric unit.  On my examination today he is severely hard of hearing, and alert and oriented only to 1.  He was resting comfortably in the bed, and was easily aroused.  His current medications include Depakote DR 125 mg p.o. twice daily, Neurontin 100 mg p.o. every morning and 400 mg p.o. nightly, lorazepam 1 mg p.o. x1, Namenda 10 mg p.o. twice daily and mirtazapine 15 mg p.o. nightly.  Review of his laboratories revealed essentially normal electrolytes, normal liver function enzymes.  His white blood cell count was 5.2 and the rest of his CBC was within normal limits.  He had a CT scan of the head done on 4/13 that showed no evidence of acute intracranial abnormality.  There was progressive cerebral atrophy as well as chronic small vessel disease.  His EKG was sinus rhythm with multiple PVCs.  Review of the electronic medical  record revealed on 08/06/2018 when he was seen at the [facility] neurology clinic his [place] had been discontinued as well as his Aricept and Depakote.  He had been written for gabapentin as in the chart, and his mirtazapine had been started at 7.5 mg p.o. nightly.  Review of the electronic medical record revealed essentially no different examination today as the one of 09/24/2018.  Past Psychiatric History: At least according to the electronic medical record his only diagnosis is the advanced Alzheimer's disease.  I would assume that the mirtazapine was for appetite stimulation as well as sleep.  Risk to Self:  Minimal Risk to Others:  Minimal Prior Inpatient Therapy:   Prior Outpatient Therapy:    Past Medical History:  Past Medical History:  Diagnosis Date  . Allergy   . Back problem   . Cancer ([place]) 2006, 2013   nose, left ear  . CHF (congestive heart failure) ([place])   . Chronic kidney disease   . Dementia ([place]ime Villegas)   . Joint ache     Past Surgical History:  Procedure Laterality Date  . BACK SURGERY    . EXTERNAL EAR SURGERY Left 2013   cancer  . HERNIA REPAIR     left inguinal x 2 right inguinal x 1  . HERNIA REPAIR Right 07/09/14   Bard onlay polypropylene mesh for recurrent hernia repair  . KNEE SURGERY     Family History: No family history on file. Family Psychiatric  History: Noncontributory Social History:  Social History   Substance  and Sexual Activity  Alcohol Use No  . Alcohol/week: 0.0 standard drinks     Social History   Substance and Sexual Activity  Drug Use No    Social History   Socioeconomic History  . Marital status: Married    Spouse name: Not on file  . Number of children: Not on file  . Years of education: Not on file  . Highest education level: Not on file  Occupational History  . Not on file  Social Needs  . Financial resource strain: Not on file  . Food insecurity:    Worry: Not on file    Inability: Not on file  . Transportation needs:     Medical: Not on file    Non-medical: Not on file  Tobacco Use  . Smoking status: Former Smoker    Packs/day: 1.00    Years: 40.00    Pack years: 40.00    Types: Cigarettes    Last attempt to quit: 06/13/1973    Years since quitting: 45.3  . Smokeless tobacco: Never Used  Substance and Sexual Activity  . Alcohol use: No    Alcohol/week: 0.0 standard drinks  . Drug use: No  . Sexual activity: Not on file  Lifestyle  . Physical activity:    Days per week: Not on file    Minutes per session: Not on file  . Stress: Not on file  Relationships  . Social connections:    Talks on phone: Not on file    Gets together: Not on file    Attends religious service: Not on file    Active member of club or organization: Not on file    Attends meetings of clubs or organizations: Not on file    Relationship status: Not on file  Other Topics Concern  . Not on file  Social History Narrative  . Not on file   Additional Social History:    Allergies:   Allergies  Allergen Reactions  . Aspirin   . Bee Pollen     Bee stings  . Darvon [Propoxyphene] Other (See Comments)    hallucinations  . Epinephrine Other (See Comments)    hallucinate  . Penicillins Swelling  . Sulfa Antibiotics Swelling  . Xylocaine [Lidocaine Hcl] Other (See Comments)    hallucinate    Labs:  Results for orders placed or performed during the hospital encounter of 09/24/18 (from the past 48 hour(s))  Comprehensive metabolic panel     Status: Abnormal   Collection Time: 09/24/18  5:58 PM  Result Value Ref Range   Sodium 140 135 - 145 mmol/L   Potassium 3.5 3.5 - 5.1 mmol/L   Chloride 100 98 - 111 mmol/L   CO2 27 22 - 32 mmol/L   Glucose, Bld 96 70 - 99 mg/dL   BUN 18 8 - 23 mg/dL   Creatinine, Ser 1.10 0.61 - 1.24 mg/dL   Calcium 9.0 8.9 - 10.3 mg/dL   Total Protein 7.1 6.5 - 8.1 g/dL   Albumin 3.9 3.5 - 5.0 g/dL   AST 22 15 - 41 U/L   ALT 10 0 - 44 U/L   Alkaline Phosphatase 31 (L) 38 - 126 U/L   Total  Bilirubin 1.2 0.3 - 1.2 mg/dL   GFR calc non Af Amer >60 >60 mL/min   GFR calc Af Amer >60 >60 mL/min   Anion gap 13 5 - 15    Comment: Performed at [facility], [address] [place], [place]  [address]  CBC     Status: None   Collection Time: 09/24/18  5:58 PM  Result Value Ref Range   WBC 5.2 4.0 - 10.5 K/uL   RBC 4.34 4.22 - 5.81 MIL/uL   Hemoglobin 14.0 13.0 - 17.0 g/dL   HCT 40.7 39.0 - 52.0 %   MCV 93.8 80.0 - 100.0 fL   MCH 32.3 26.0 - 34.0 pg   MCHC 34.4 30.0 - 36.0 g/dL   RDW 12.1 11.5 - 15.5 %   Platelets 154 150 - 400 K/uL   nRBC 0.0 0.0 - 0.2 %    Comment: Performed at [facility], [address] [place], [place] [address]  Troponin I - ONCE - STAT     Status: None   Collection Time: 09/24/18  5:58 PM  Result Value Ref Range   Troponin I <0.03 <0.03 ng/mL    Comment: Performed at [facility], [address] [place], [place] [address]    Current Facility-Administered Medications  Medication Dose Route Frequency Provider Last Rate Last Dose  . aspirin EC tablet 81 mg  81 mg Oral Once per day on Mon Thu Robinson, Patrick, MD   81 mg at 09/24/18 2205  . divalproex (DEPAKOTE) DR tablet 125 mg  125 mg Oral BID Merlyn Lot, MD   125 mg at 09/24/18 2204  . gabapentin (NEURONTIN) capsule 100 mg  100 mg Oral Particia Nearing, MD      . gabapentin (NEURONTIN) capsule 400 mg  400 mg Oral QHS Merlyn Lot, MD   400 mg at 09/24/18 2205  . memantine (NAMENDA) tablet 10 mg  10 mg Oral BID Merlyn Lot, MD   10 mg at 09/24/18 2204  . mirtazapine (REMERON) tablet 15 mg  15 mg Oral QHS Merlyn Lot, MD   15 mg at 09/24/18 2204   Current Outpatient Medications  Medication Sig Dispense Refill  . aspirin EC 81 MG tablet Take 81 mg by mouth 2 (two) times a week.    . cetirizine (ZYRTEC) 10 MG tablet Take 10 mg by mouth daily.    . divalproex (DEPAKOTE) 125 MG DR tablet Take 125 mg by mouth 2 (two) times daily.    Marland Kitchen  gabapentin (NEURONTIN) 100 MG capsule Take 100 mg by mouth every morning.    . gabapentin (NEURONTIN) 400 MG capsule Take 400 mg by mouth Nightly.    . mirtazapine (REMERON) 15 MG tablet Take 15 mg by mouth at bedtime.    . vitamin B-12 (CYANOCOBALAMIN) 1000 MCG tablet Take 1,000 mcg by mouth daily.    Mariane Baumgarten Calcium (STOOL SOFTENER PO) Take by mouth as needed.    . fluticasone (FLOVENT HFA) 110 MCG/ACT inhaler Inhale 1 puff into the lungs 2 (two) times daily.    Marland Kitchen ibuprofen (ADVIL,MOTRIN) 200 MG tablet Take 200 mg by mouth every 6 (six) hours as needed.    . memantine (NAMENDA) 10 MG tablet Take 10 mg by mouth 2 (two) times daily.    . traMADol (ULTRAM) 50 MG tablet Take 50 mg by mouth 2 (two) times daily.      Musculoskeletal: Strength & Muscle Tone: decreased Gait & Station: Lying in a bed Patient leans: N/A  Psychiatric Specialty Exam: Physical Exam  Constitutional: He appears well-developed.  HENT:  Head: Normocephalic and atraumatic.  Respiratory: Effort normal.  Neurological: He is alert.    ROS  Blood pressure (!) 166/81, pulse 81, temperature 98.2 F (36.8 C), temperature source Oral,  resp. rate 14, SpO2 99 %.There is no height or weight on file to calculate BMI.  General Appearance: Disheveled  Eye Contact:  Minimal  Speech:  Normal Rate  Volume:  Decreased  Mood:  Euthymic  Affect:  Blunt  Thought Process:  Coherent  Orientation:  Other:  Patient is only oriented to 1.  Thought Content:  NA  Suicidal Thoughts:  No  Homicidal Thoughts:  No  Memory:  Immediate;   Poor Recent;   Poor Remote;   Poor  Judgement:  Impaired  Insight:  Lacking  Psychomotor Activity:  Decreased  Concentration:  Concentration: Poor and Attention Span: Poor  Recall:  Poor  Fund of Knowledge:  Poor  Language:  Fair  Akathisia:  Negative  Handed:  Right  AIMS (if indicated):     Assets:  Resilience  ADL's:  Impaired  Cognition:  Impaired,  Severe  Sleep:        Treatment  Plan Summary: Daily contact with patient to assess and evaluate symptoms and progress in treatment, Medication management and Plan : Patient is seen and examined.  Patient is an 83 year old male with the above-stated past psychiatric history was seen in the [facility] emergency department on 09/25/2018.  Unfortunately patient is suffering from advanced late onset Alzheimer's disease.  He has been on appropriate medications, but his disease has advanced.  The laboratories that have been obtained at this point are within normal limits.  He does not appear to be either suicidal or homicidal.  There is no evidence of psychosis at least at this point.  He most likely needs nursing home placement or placement in a Alzheimer's unit or geriatric psychiatric unit.  I would recommend at least obtaining a urinalysis.  Although unlikely given his noncompliance with medications as per his wife that he would be Depakote toxic, but it would be good to document that level.  It appears very clear that his wife is unable to care for him, but he does not fulfill criteria for admission to the inpatient psychiatric unit at this time.  Recommendation of no change in medications currently.  Disposition: : Patient does not fulfill criteria for inpatient psychiatric admission.  Recommendation of either transfer to a geriatric psychiatric facility or nursing home placement at this time.  Sharma Covert, MD 09/25/2018 8:34 AM

## 2018-09-25 NOTE — ED Notes (Signed)
Left with Son Susumu Hackler. Left with all of belongings including cane.

## 2018-09-25 NOTE — ED Notes (Addendum)
Pt continues to get out of bed. Pt does not follow verbal reorientation.

## 2018-09-25 NOTE — ED Provider Notes (Signed)
-----------------------------------------   6:36 AM on 09/25/2018 -----------------------------------------   Blood pressure (!) 166/81, pulse 81, temperature 98.2 F (36.8 C), temperature source Oral, resp. rate 14, SpO2 99 %.  The patient is sleeping at this time.  There have been no acute events since the last update.  Awaiting disposition plan from social work team.   Paulette Blanch, MD 09/25/18 [phone]

## 2018-09-25 NOTE — ED Notes (Signed)
Patient ambulated to Bathroom. Peri-care/ Skin care was provided.

## 2018-09-25 NOTE — ED Notes (Signed)
PT Unable to assess due to patients orientation and noncompliance at this time.

## 2018-09-25 NOTE — ED Notes (Signed)
Pt attempting to get out of bed. Bed alarm functioning correctly

## 2018-09-25 NOTE — Discharge Instructions (Signed)
Return to the emergency department for acute changes in mental status, fever, or any other symptoms concerning to you.

## 2018-09-25 NOTE — Progress Notes (Signed)
12:41PM - CSW contacted pt's wife Mahad Newstrom [phone]) and informed her that pt does not meet criteria for SNF per PT consult. Pt's wife was informed that she can set up placement for ALF at home. Pt's wife was upset, and stated "I don't understand, ya'll help and then y'all don't."  Pt's daughter spoke to CSW afterwards, and shared that he wanders, and does not take his medications. Pt's daughter stated that she will like to contact facilities right away. CSW assured that a list of ALFs will be emailed to her.  Pt's daughter shared that she will be unable to pick up pt until sometime this afternoon.

## 2018-09-25 NOTE — ED Notes (Signed)
Daughter called to check on father.Update given

## 2018-09-25 NOTE — ED Notes (Signed)
Pt repeatedly to attemps to get up from bed.

## 2018-09-25 NOTE — ED Notes (Signed)
Pt continues to make attempts to get out of bed. Pt was offered a lunch tray but refused it.

## 2018-09-25 NOTE — ED Notes (Addendum)
Spoke to wife Jaime Villegas on the phone. Stated that the earliest her daughter can come and get Jaime Villegas is 5:30/ 6:00pm today.    Daughter Phone- [phone]

## 2018-09-25 NOTE — ED Notes (Addendum)
Pt continues to get out of bed despite multiple strategies of reorientation. Pt linens and brief clean and dry. Refusing drink or snack. Given warm blankets. EDP notified. Bed alarm on and functioning.

## 2018-09-25 NOTE — Progress Notes (Signed)
PT Cancellation Note  Patient Details Name: Jaime Villegas Sr. MRN: [id number] DOB: 10-17-1935   Cancelled Treatment:    Reason Eval/Treat Not Completed: PT screened, no needs identified, will sign off. Chart reviewed, RN & CSW consulted. Pt received in bed, awake, clam, attends to visual and audible stimulation for brief moments. Pt engaged with verbal stimulation, and written communication. Pt does redirects attention to author each time Pryor Curia speaks, a sign of intact hearing, however pt offers no meaningful change in facial expression, no attempt at inquiry for clarification, no attempt to respond in a meaningful way. In light of pt's end-stage dementia, pt's presentation is consistent with dementia related language deficits. Pt not interactive at this time. Pt unable to follow simple commands, and thus unable to participate in skilled rehabilitation services. Pt is not appropriate for rehabilitation at this time. Recommend 24/7 supervision and supervision with all mobility at DC based on reports of mobility with RN. PT signing off.   11:33 AM, 09/25/18 Etta Grandchild, PT, DPT Physical Therapist - [facility]  [phone] ([place])    Buccola,Allan C 09/25/2018, 11:27 AM

## 2018-10-25 ENCOUNTER — Encounter: Payer: Self-pay | Admitting: Emergency Medicine

## 2018-10-25 ENCOUNTER — Emergency Department: Payer: Medicare Other

## 2018-10-25 ENCOUNTER — Other Ambulatory Visit: Payer: Self-pay

## 2018-10-25 ENCOUNTER — Inpatient Hospital Stay
Admission: EM | Admit: 2018-10-25 | Discharge: 2018-11-07 | DRG: 689 | Disposition: A | Discharge status: Hospice | Payer: Medicare Other | Attending: Internal Medicine | Admitting: Internal Medicine

## 2018-10-25 DIAGNOSIS — G301 Alzheimer's disease with late onset: Secondary | ICD-10-CM | POA: Diagnosis present

## 2018-10-25 DIAGNOSIS — F0390 Unspecified dementia without behavioral disturbance: Secondary | ICD-10-CM | POA: Diagnosis present

## 2018-10-25 DIAGNOSIS — Z79899 Other long term (current) drug therapy: Secondary | ICD-10-CM

## 2018-10-25 DIAGNOSIS — H9190 Unspecified hearing loss, unspecified ear: Secondary | ICD-10-CM | POA: Diagnosis present

## 2018-10-25 DIAGNOSIS — N189 Chronic kidney disease, unspecified: Secondary | ICD-10-CM | POA: Diagnosis present

## 2018-10-25 DIAGNOSIS — J9601 Acute respiratory failure with hypoxia: Secondary | ICD-10-CM | POA: Diagnosis present

## 2018-10-25 DIAGNOSIS — A419 Sepsis, unspecified organism: Secondary | ICD-10-CM | POA: Diagnosis not present

## 2018-10-25 DIAGNOSIS — N390 Urinary tract infection, site not specified: Secondary | ICD-10-CM | POA: Diagnosis not present

## 2018-10-25 DIAGNOSIS — M7989 Other specified soft tissue disorders: Secondary | ICD-10-CM | POA: Diagnosis present

## 2018-10-25 DIAGNOSIS — R410 Disorientation, unspecified: Secondary | ICD-10-CM | POA: Diagnosis not present

## 2018-10-25 DIAGNOSIS — N3000 Acute cystitis without hematuria: Secondary | ICD-10-CM | POA: Diagnosis not present

## 2018-10-25 DIAGNOSIS — F0281 Dementia in other diseases classified elsewhere with behavioral disturbance: Secondary | ICD-10-CM | POA: Diagnosis present

## 2018-10-25 DIAGNOSIS — R2242 Localized swelling, mass and lump, left lower limb: Secondary | ICD-10-CM

## 2018-10-25 DIAGNOSIS — N179 Acute kidney failure, unspecified: Secondary | ICD-10-CM | POA: Diagnosis present

## 2018-10-25 DIAGNOSIS — I509 Heart failure, unspecified: Secondary | ICD-10-CM | POA: Diagnosis present

## 2018-10-25 DIAGNOSIS — R531 Weakness: Secondary | ICD-10-CM | POA: Diagnosis not present

## 2018-10-25 DIAGNOSIS — Z66 Do not resuscitate: Secondary | ICD-10-CM | POA: Diagnosis present

## 2018-10-25 DIAGNOSIS — Z7951 Long term (current) use of inhaled steroids: Secondary | ICD-10-CM

## 2018-10-25 DIAGNOSIS — Z87891 Personal history of nicotine dependence: Secondary | ICD-10-CM

## 2018-10-25 DIAGNOSIS — Z1159 Encounter for screening for other viral diseases: Secondary | ICD-10-CM

## 2018-10-25 DIAGNOSIS — G4700 Insomnia, unspecified: Secondary | ICD-10-CM | POA: Diagnosis present

## 2018-10-25 DIAGNOSIS — R0602 Shortness of breath: Secondary | ICD-10-CM

## 2018-10-25 DIAGNOSIS — J690 Pneumonitis due to inhalation of food and vomit: Secondary | ICD-10-CM

## 2018-10-25 DIAGNOSIS — Z7982 Long term (current) use of aspirin: Secondary | ICD-10-CM

## 2018-10-25 DIAGNOSIS — E872 Acidosis: Secondary | ICD-10-CM | POA: Diagnosis present

## 2018-10-25 DIAGNOSIS — R509 Fever, unspecified: Secondary | ICD-10-CM

## 2018-10-25 DIAGNOSIS — Z515 Encounter for palliative care: Secondary | ICD-10-CM | POA: Diagnosis present

## 2018-10-25 DIAGNOSIS — E869 Volume depletion, unspecified: Secondary | ICD-10-CM | POA: Diagnosis present

## 2018-10-25 DIAGNOSIS — E860 Dehydration: Secondary | ICD-10-CM | POA: Diagnosis present

## 2018-10-25 LAB — URINALYSIS, COMPLETE (UACMP) WITH MICROSCOPIC
Bacteria, UA: NONE SEEN
Bilirubin Urine: NEGATIVE
Glucose, UA: NEGATIVE mg/dL
Ketones, ur: 5 mg/dL — AB
Nitrite: NEGATIVE
Protein, ur: 100 mg/dL — AB
Specific Gravity, Urine: 1.021 (ref 1.005–1.030)
WBC, UA: >50 WBC/hpf — ABNORMAL HIGH (ref 0–5)
pH: 6 (ref 5.0–8.0)

## 2018-10-25 LAB — COMPREHENSIVE METABOLIC PANEL
ALT: 8 U/L (ref 0–44)
AST: 17 U/L (ref 15–41)
Albumin: 3 g/dL — ABNORMAL LOW (ref 3.5–5.0)
Alkaline Phosphatase: 28 U/L — ABNORMAL LOW (ref 38–126)
Anion gap: 7 (ref 5–15)
BUN: 25 mg/dL — ABNORMAL HIGH (ref 8–23)
CO2: 31 mmol/L (ref 22–32)
Calcium: 8.3 mg/dL — ABNORMAL LOW (ref 8.9–10.3)
Chloride: 102 mmol/L (ref 98–111)
Creatinine, Ser: 1.09 mg/dL (ref 0.61–1.24)
GFR calc Af Amer: >60 mL/min (ref >60)
GFR calc non Af Amer: >60 mL/min (ref >60)
Glucose, Bld: 106 mg/dL — ABNORMAL HIGH (ref 70–99)
Potassium: 3.5 mmol/L (ref 3.5–5.1)
Sodium: 140 mmol/L (ref 135–145)
Total Bilirubin: 0.3 mg/dL (ref 0.3–1.2)
Total Protein: 6.2 g/dL — ABNORMAL LOW (ref 6.5–8.1)

## 2018-10-25 LAB — TROPONIN I: Troponin I: <0.03 ng/mL (ref <0.03)

## 2018-10-25 LAB — CBC WITH DIFFERENTIAL/PLATELET
Abs Immature Granulocytes: 0.03 10*3/uL (ref 0.00–0.07)
Basophils Absolute: 0.1 10*3/uL (ref 0.0–0.1)
Basophils Relative: 1 %
Eosinophils Absolute: 0.4 10*3/uL (ref 0.0–0.5)
Eosinophils Relative: 9 %
HCT: 33.5 % — ABNORMAL LOW (ref 39.0–52.0)
Hemoglobin: 11.1 g/dL — ABNORMAL LOW (ref 13.0–17.0)
Immature Granulocytes: 1 %
Lymphocytes Relative: 35 %
Lymphs Abs: 1.6 10*3/uL (ref 0.7–4.0)
MCH: 32.1 pg (ref 26.0–34.0)
MCHC: 33.1 g/dL (ref 30.0–36.0)
MCV: 96.8 fL (ref 80.0–100.0)
Monocytes Absolute: 0.5 10*3/uL (ref 0.1–1.0)
Monocytes Relative: 12 %
Neutro Abs: 1.9 10*3/uL (ref 1.7–7.7)
Neutrophils Relative %: 42 %
Platelets: 128 10*3/uL — ABNORMAL LOW (ref 150–400)
RBC: 3.46 MIL/uL — ABNORMAL LOW (ref 4.22–5.81)
RDW: 13.3 % (ref 11.5–15.5)
WBC: 4.5 10*3/uL (ref 4.0–10.5)
nRBC: 0 % (ref 0.0–0.2)

## 2018-10-25 MED ORDER — LORAZEPAM 2 MG/ML IJ SOLN
INTRAMUSCULAR | Status: AC
  Administered 2018-10-25: 23:00:00 1 mg
  Filled 2018-10-25: qty 1

## 2018-10-25 NOTE — ED Notes (Signed)
Pt continues to try and get out of bed. Bed is placed in trendelenburg. Pt is demented and is unable to be redirected successfully. Daughter at bedside.

## 2018-10-25 NOTE — ED Provider Notes (Signed)
[facility] Emergency Department Provider Note  ____________________________________________  Time seen: Approximately 10:27 PM  I have reviewed the triage vital signs and the nursing notes.   HISTORY  Chief Complaint Leg Swelling and Insomnia  Level 5 caveat:  Portions of the history and physical were unable to be obtained due to dementia   HPI Jaime SAFI Sr. is a 83 y.o. male with a history of advanced dementia, CHF, chronic kidney disease who presents from home for insomnia and left foot swelling.  According to EMS patient has not slept in 3 days.  Patient has given him trazodone and Seroquel and patient is still not sleeping.  He has been very agitated and family has been unable to care for him.  They also noticed some swelling on his left foot.  Patient is nonverbal and unable to provide any history.  He has had no fever, no cough, no vomiting, no diarrhea.  Past Medical History:  Diagnosis Date  . Allergy   . Back problem   . Cancer ([place]) 2006, 2013   nose, left ear  . CHF (congestive heart failure) ([place])   . Chronic kidney disease   . Dementia ([place])   . Joint ache     Patient Active Problem List   Diagnosis Date Noted  . Dementia ([place]) 09/24/2018  . Right inguinal hernia 06/20/2014    Past Surgical History:  Procedure Laterality Date  . BACK SURGERY    . EXTERNAL EAR SURGERY Left 2013   cancer  . HERNIA REPAIR     left inguinal x 2 right inguinal x 1  . HERNIA REPAIR Right 07/09/14   Bard onlay polypropylene mesh for recurrent hernia repair  . KNEE SURGERY      Prior to Admission medications   Medication Sig Start Date End Date Taking? Authorizing Provider  aspirin EC 81 MG tablet Take 81 mg by mouth 2 (two) times a week.    [provider]  cetirizine (ZYRTEC) 10 MG tablet Take 10 mg by mouth daily.    [provider]  divalproex (DEPAKOTE) 125 MG DR tablet Take 125 mg by mouth 2 (two) times daily. 09/18/18    [provider]  Docusate Calcium (STOOL SOFTENER PO) Take by mouth as needed.    [provider]  fluticasone (FLOVENT HFA) 110 MCG/ACT inhaler Inhale 1 puff into the lungs 2 (two) times daily.    [provider]  gabapentin (NEURONTIN) 100 MG capsule Take 100 mg by mouth every morning. 08/06/18   [provider]  gabapentin (NEURONTIN) 400 MG capsule Take 400 mg by mouth Nightly. 08/06/18   [provider]  ibuprofen (ADVIL,MOTRIN) 200 MG tablet Take 200 mg by mouth every 6 (six) hours as needed.    [provider]  memantine (NAMENDA) 10 MG tablet Take 10 mg by mouth 2 (two) times daily. 07/31/18   [provider]  mirtazapine (REMERON) 15 MG tablet Take 15 mg by mouth at bedtime. 09/18/18   [provider]  traMADol (ULTRAM) 50 MG tablet Take 50 mg by mouth 2 (two) times daily. 08/06/18   [provider]  vitamin B-12 (CYANOCOBALAMIN) 1000 MCG tablet Take 1,000 mcg by mouth daily.    [provider]    Allergies Aspirin; Bee pollen; Darvon [propoxyphene]; Epinephrine; Penicillins; Sulfa antibiotics; and Xylocaine [lidocaine hcl]  History reviewed. No pertinent family history.  Social History Social History   Tobacco Use  . Smoking status: Former Smoker  Packs/day: 1.00    Years: 40.00    Pack years: 40.00    Types: Cigarettes    Last attempt to quit: 06/13/1973    Years since quitting: 45.3  . Smokeless tobacco: Never Used  Substance Use Topics  . Alcohol use: No    Alcohol/week: 0.0 standard drinks  . Drug use: No    Review of Systems  Constitutional: Negative for fever. + insomnia Respiratory: Negative for shortness of breath or cough Gastrointestinal: Negative for vomiting or diarrhea.  ____________________________________________   PHYSICAL EXAM:  VITAL SIGNS: ED Triage Vitals  Enc Vitals Group     BP 10/25/18 2212 140/72     Pulse Rate 10/25/18 2212 66     Resp 10/25/18 2212  18     Temp 10/25/18 2212 97.9 F (36.6 C)     Temp Source 10/25/18 2212 Oral     SpO2 10/25/18 2212 100 %     Weight 10/25/18 2213 170 lb (77.1 kg)     Height 10/25/18 2213 5\' 8"  (1.727 m)     Head Circumference --      Peak Flow --      Pain Score --      Pain Loc --      Pain Edu? --      Excl. in [place]? --     Constitutional: Alert, non verbal, does not follow commands. HEENT:      Head: Normocephalic and atraumatic.         Eyes: Conjunctivae are normal. Sclera is non-icteric.       Mouth/Throat: Mucous membranes are moist.       Neck: Supple with no signs of meningismus. Cardiovascular: Regular rate and rhythm. No murmurs, gallops, or rubs.  Respiratory: Normal respiratory effort. Lungs are clear to auscultation bilaterally. No wheezes, crackles, or rhonchi.  Gastrointestinal: Soft, non tender, and non distended with positive bowel sounds. No rebound or guarding. Musculoskeletal: Mild swelling noticed on the dorsum of the left foot with no erythema, warmth, tenderness, deformities.   Neurologic: Face is symmetric. Moving all extremities.  Skin: Skin is warm, dry and intact. No rash noted.  ____________________________________________   LABS (all labs ordered are listed, but only abnormal results are displayed)  Labs Reviewed  CBC WITH DIFFERENTIAL/PLATELET - Abnormal; Notable for the following components:      Result Value   RBC 3.46 (*)    Hemoglobin 11.1 (*)    HCT 33.5 (*)    Platelets 128 (*)    All other components within normal limits  COMPREHENSIVE METABOLIC PANEL - Abnormal; Notable for the following components:   Glucose, Bld 106 (*)    BUN 25 (*)    Calcium 8.3 (*)    Total Protein 6.2 (*)    Albumin 3.0 (*)    Alkaline Phosphatase 28 (*)    All other components within normal limits  TROPONIN I  URINALYSIS, COMPLETE (UACMP) WITH MICROSCOPIC   ____________________________________________  EKG  ED ECG REPORT I, Rudene Re, the attending  physician, personally viewed and interpreted this ECG.  Normal sinus rhythm, rate of 65, first-degree AV block, normal QTC, normal axis, no ST elevations or depressions.  Unchanged from prior ____________________________________________  RADIOLOGY  I have personally reviewed the images performed during this visit and I agree with the Radiologist's read.   Interpretation by Radiologist:  Dg Foot 2 Views Left  Result Date: 10/25/2018 CLINICAL DATA:  Left foot swelling. EXAM: LEFT FOOT - 2 VIEW COMPARISON:  None.  FINDINGS: There is no evidence of fracture or dislocation. Pes cavus (high arches). Scattered osteoarthritis. There is no other evidence of arthropathy or other focal bone abnormality. There are vascular calcifications. Soft tissue edema overlying the dorsum of the metatarsals IMPRESSION: Dorsal soft tissue edema. No acute osseous abnormality. Electronically Signed   By: Keith Rake M.D.   On: 10/25/2018 22:51      ____________________________________________   PROCEDURES  Procedure(s) performed: None Procedures Critical Care performed:  None ____________________________________________   INITIAL IMPRESSION / ASSESSMENT AND PLAN / ED COURSE  83 y.o. male with a history of advanced dementia, CHF, chronic kidney disease who presents from home for insomnia and left foot swelling.   # insomnia: patient started on trazodone and Seroquel with no success.  Most likely from his dementia.  Will check labs and urinalysis to rule out an organic cause such as dehydration, UTI, electrolyte abnormalities.   # foot swelling: No obvious tenderness or deformity.  There is slight swelling of the dorsum of the left foot.  No signs of cellulitis.  X-rays showed no obvious deformities. Doppler US pending.   _________________________ 11:14 PM on 10/25/2018 -----------------------------------------  UA and ultrasound pending.  Care transferred to Dr. Owens Shark      As part of my medical  decision making, I reviewed the following data within the [place] History obtained from family, Labs reviewed , EKG interpreted , Old chart reviewed, Radiograph reviewed , Notes from prior ED visits and [place] Controlled Substance Database    Pertinent labs & imaging results that were available during my care of the patient were reviewed by me and considered in my medical decision making (see chart for details).    ____________________________________________   FINAL CLINICAL IMPRESSION(S) / ED DIAGNOSES  Final diagnoses:  Insomnia, unspecified type  Localized swelling of left foot      NEW MEDICATIONS STARTED DURING THIS VISIT:  ED Discharge Orders    None       Note:  This document was prepared using Dragon voice recognition software and may include unintentional dictation errors.    Alfred Levins, [place], MD 10/25/18 [phone]

## 2018-10-25 NOTE — ED Triage Notes (Signed)
Pt from home via [facility]. Per EMS, pt's family pt has a hx of dementia; pt unable to sleep for 3x days; diarrhea for 2 days; left leg swelling. Pt has a Rx for trazodone,per family, pt taking medication as prescribed but pt unable to sleep to 3 days. NAD noted upon arrival. Pt is calm at this time.

## 2018-10-25 NOTE — ED Notes (Signed)
Pt is pulling off all leads, bp cuff and pulse ox.

## 2018-10-25 NOTE — ED Notes (Signed)
Pt is quiet at this time.

## 2018-10-26 DIAGNOSIS — R531 Weakness: Secondary | ICD-10-CM

## 2018-10-26 LAB — TSH: TSH: 5.369 u[IU]/mL — ABNORMAL HIGH (ref 0.350–4.500)

## 2018-10-26 LAB — SARS CORONAVIRUS 2 BY RT PCR (HOSPITAL ORDER, PERFORMED IN ~~LOC~~ HOSPITAL LAB): SARS Coronavirus 2: NEGATIVE

## 2018-10-26 MED ORDER — SODIUM CHLORIDE 0.9 % IV SOLN
1.0000 g | Freq: Once | INTRAVENOUS | Status: AC
  Administered 2018-10-26: 02:00:00 1 g via INTRAVENOUS
  Filled 2018-10-26: qty 10

## 2018-10-26 MED ORDER — ENOXAPARIN SODIUM 40 MG/0.4ML ~~LOC~~ SOLN
40.0000 mg | SUBCUTANEOUS | Status: DC
  Administered 2018-10-26 – 2018-10-31 (×5): 40 mg via SUBCUTANEOUS
  Filled 2018-10-26 (×5): qty 0.4

## 2018-10-26 MED ORDER — ONDANSETRON HCL 4 MG/2ML IJ SOLN: 4.0000 mg | Freq: Four times a day (QID) | INTRAMUSCULAR | Status: DC | PRN

## 2018-10-26 MED ORDER — DONEPEZIL HCL 5 MG PO TABS
10.0000 mg | ORAL_TABLET | Freq: Every day | ORAL | Status: DC
  Administered 2018-10-26 – 2018-10-29 (×4): 10 mg via ORAL
  Filled 2018-10-26 (×5): qty 2

## 2018-10-26 MED ORDER — ACETAMINOPHEN 325 MG PO TABS: 650.0000 mg | ORAL_TABLET | Freq: Four times a day (QID) | ORAL | Status: DC | PRN

## 2018-10-26 MED ORDER — DIVALPROEX SODIUM 125 MG PO DR TAB
125.0000 mg | DELAYED_RELEASE_TABLET | Freq: Two times a day (BID) | ORAL | Status: DC
  Administered 2018-10-26: 125 mg via ORAL
  Filled 2018-10-26 (×3): qty 1

## 2018-10-26 MED ORDER — SODIUM CHLORIDE 0.9 % IV SOLN
1.0000 g | INTRAVENOUS | Status: DC
  Administered 2018-10-27 – 2018-10-30 (×4): 1 g via INTRAVENOUS
  Filled 2018-10-26: qty 10
  Filled 2018-10-26: qty 1
  Filled 2018-10-26: qty 10
  Filled 2018-10-26 (×2): qty 1

## 2018-10-26 MED ORDER — DOCUSATE SODIUM 100 MG PO CAPS
100.0000 mg | ORAL_CAPSULE | Freq: Two times a day (BID) | ORAL | Status: DC
  Filled 2018-10-26: qty 1

## 2018-10-26 MED ORDER — SODIUM CHLORIDE 0.9 % IV SOLN
INTRAVENOUS | Status: DC
  Administered 2018-10-26 (×2): via INTRAVENOUS

## 2018-10-26 MED ORDER — ACETAMINOPHEN 650 MG RE SUPP
650.0000 mg | Freq: Four times a day (QID) | RECTAL | Status: DC | PRN
  Administered 2018-10-30 – 2018-11-07 (×3): 650 mg via RECTAL
  Filled 2018-10-26 (×3): qty 1

## 2018-10-26 MED ORDER — HALOPERIDOL LACTATE 5 MG/ML IJ SOLN
5.0000 mg | Freq: Four times a day (QID) | INTRAMUSCULAR | Status: DC | PRN
  Administered 2018-10-27 – 2018-10-29 (×4): 5 mg via INTRAVENOUS
  Filled 2018-10-26 (×5): qty 1

## 2018-10-26 MED ORDER — TRAZODONE HCL 50 MG PO TABS
50.0000 mg | ORAL_TABLET | Freq: Every evening | ORAL | Status: DC | PRN
  Administered 2018-10-26: 50 mg via ORAL
  Filled 2018-10-26: qty 1

## 2018-10-26 MED ORDER — ASPIRIN EC 81 MG PO TBEC
81.0000 mg | DELAYED_RELEASE_TABLET | Freq: Every day | ORAL | Status: DC
  Administered 2018-10-29 – 2018-10-30 (×2): 81 mg via ORAL
  Filled 2018-10-26 (×2): qty 1

## 2018-10-26 MED ORDER — MEMANTINE HCL 10 MG PO TABS
10.0000 mg | ORAL_TABLET | Freq: Two times a day (BID) | ORAL | Status: DC
  Administered 2018-10-26 – 2018-10-30 (×6): 10 mg via ORAL
  Filled 2018-10-26 (×9): qty 1

## 2018-10-26 MED ORDER — NEPRO/CARBSTEADY PO LIQD
237.0000 mL | Freq: Two times a day (BID) | ORAL | Status: DC
  Administered 2018-10-27 – 2018-10-28 (×2): 237 mL via ORAL

## 2018-10-26 MED ORDER — QUETIAPINE FUMARATE 25 MG PO TABS
50.0000 mg | ORAL_TABLET | Freq: Every day | ORAL | Status: DC
  Administered 2018-10-26 – 2018-10-29 (×4): 50 mg via ORAL
  Filled 2018-10-26 (×4): qty 2

## 2018-10-26 MED ORDER — ONDANSETRON HCL 4 MG PO TABS: 4.0000 mg | ORAL_TABLET | Freq: Four times a day (QID) | ORAL | Status: DC | PRN

## 2018-10-26 NOTE — TOC Initial Note (Addendum)
Transition of Care (TOC) - Initial/Assessment Note    Patient Details  Name: Jaime RHINE Sr. MRN: [id number] Date of Birth: Jul 24, 1935  Transition of Care [facility]) CM/SW Contact:    Shade Flood, LCSW Phone Number: 10/26/2018, 1:28 PM  Clinical Narrative:                  Received consult for respite placement. Reviewed pt's record. Pt with dementia and he is unable to participate in [facility] assessment. Unable to reach family (dtr or wife) by phone. Voicemail message left with dtr requesting return call. Pt with UTI per MD notes. Pt currently in mitts. It appears from the notes that pt has advanced dementia but the three days prior to admission have been stressful as pt was not sleeping at all and his behavior changed significantly.   ED LCSW saw pt earlier this week and pt's wife was informed at that time that pt was not a candidate for Short term rehab covered by Medicare at that time based on PT assessment. TOC will follow during this stay to further assess and assist with placement if needed.   1530--Received return call from pt's wife. Discussed pt's observation status with her. She is not pleased with this information. Pt's wife states that she has applied for Medicaid to cover pt placement in a memory care unit. She states this was turned in last week. Pt was admitted to [facility] [phone]) services just a few days ago. Pt's wife does not feel she can take pt back home and she does not have funds to pay for facility placement. If pt eligible for SNF rehab at dc, she would be interested in this. Spoke with Mitzi Hansen at [facility] about possible respite placement at dc if indicated. He states that there is a 5 day benefit for this if needed.   Will need to further assess for TOC needs and appropriate placement as pt's stay progresses.     Expected Discharge Plan: Long Term Nursing Home Barriers to Discharge: Continued Medical Work up   Patient Goals and CMS Choice         Expected Discharge Plan and Services Expected Discharge Plan: [place]       Living arrangements for the past 2 months: Single Family Home Expected Discharge Date: 10/28/18                                    Prior Living Arrangements/Services Living arrangements for the past 2 months: Single Family Home Lives with:: Spouse Patient language and need for interpreter reviewed:: Yes        Need for Family Participation in Patient Care: Yes (Comment) Care giver support system in place?: Yes (comment)   Criminal Activity/Legal Involvement Pertinent to Current Situation/Hospitalization: No - Comment as needed  Activities of Daily Living Home Assistive Devices/Equipment: Environmental consultant (specify type), Shower chair without back ADL Screening (condition at time of admission) Patient's cognitive ability adequate to safely complete daily activities?: No Is the patient deaf or have difficulty hearing?: No Does the patient have difficulty seeing, even when wearing glasses/contacts?: No Does the patient have difficulty concentrating, remembering, or making decisions?: Yes Patient able to express need for assistance with ADLs?: No Does the patient have difficulty dressing or bathing?: Yes Independently performs ADLs?: No Communication: Independent Dressing (OT): Needs assistance Is this a change from baseline?: Pre-admission baseline Grooming: Needs assistance Is this  a change from baseline?: Pre-admission baseline Feeding: Independent Bathing: Dependent Is this a change from baseline?: Pre-admission baseline Toileting: Dependent Is this a change from baseline?: Pre-admission baseline In/Out Bed: Independent Walks in Home: Independent with device (comment) Does the patient have difficulty walking or climbing stairs?: Yes Weakness of Legs: Both Weakness of Arms/Hands: None  Permission Sought/Granted                  Emotional Assessment Appearance::  Appears stated age Attitude/Demeanor/Rapport: Unable to Assess Affect (typically observed): Unable to Assess Orientation: : Oriented to Self Alcohol / Substance Use: Not Applicable Psych Involvement: No (comment)  Admission diagnosis:  Insomnia, unspecified type [G47.00] Localized swelling of left foot [R22.42] Patient Active Problem List   Diagnosis Date Noted  . Weakness 10/26/2018  . Dementia ([place]) 09/24/2018  . Right inguinal hernia 06/20/2014   PCP:  Kirk Ruths, MD Pharmacy:   [id number] [place], [address] [address] Phone: [phone] Fax: [place], [place] [facility] [address] AT [place] [facility] [place] [address] Phone: [phone] Fax: [phone]     Social Determinants of Health (SDOH) Interventions    Readmission Risk Interventions Readmission Risk Prevention Plan 10/26/2018  Transportation Screening Complete  Some recent data might be hidden

## 2018-10-26 NOTE — ED Notes (Signed)
Patient is resting comfortably. 

## 2018-10-26 NOTE — Care Management Obs Status (Signed)
[name] NOTIFICATION   Patient Details  Name: Jaime SALLS Sr. MRN: [id number] Date of Birth: 06-Jun-1936   Medicare Observation Status Notification Given:  Yes    Shade Flood, LCSW 10/26/2018, 3:44 PM

## 2018-10-26 NOTE — Evaluation (Addendum)
Clinical/Bedside Swallow Evaluation Patient Details  Name: Jaime HANNEN Sr. MRN: [id number] Date of Birth: 28-May-1936  Today's Date: 10/26/2018 Time: SLP Start Time (ACUTE ONLY): 1315 SLP Stop Time (ACUTE ONLY): 1415 SLP Time Calculation (min) (ACUTE ONLY): 60 min  Past Medical History:  Past Medical History:  Diagnosis Date  . Allergy   . Back problem   . Cancer ([place]) 2006, 2013   nose, left ear  . CHF (congestive heart failure) ([place])   . Chronic kidney disease   . Dementia ([place])   . Joint ache    Past Surgical History:  Past Surgical History:  Procedure Laterality Date  . BACK SURGERY    . EXTERNAL EAR SURGERY Left 2013   cancer  . HERNIA REPAIR     left inguinal x 2 right inguinal x 1  . HERNIA REPAIR Right 07/09/14   Bard onlay polypropylene mesh for recurrent hernia repair  . KNEE SURGERY     HPI:  Pt is a 83 y.o. male with a history of advanced Dementia, CHF, chronic kidney disease who presents from home for insomnia and left foot swelling.  According to EMS patient has not slept in 3 days.  Patient has given him trazodone and Seroquel and patient is still not sleeping.  He has been very agitated and family has been unable to care for him.  They also noticed some swelling on his left foot.  In the ED, pt became agitated and required medication.  Initial laboratory evaluation was unremarkable except for urinalysis which was consistent with infection.  Pt is currently on a regular consistency diet.    Assessment / Plan / Recommendation Clinical Impression  Pt appears to present w/ oropharyngeal phase dysphagia suspect impacted by pt's baseline status of Advanced Dementia causing decreased awareness w/ tasks including oral intake. Pt requred Mod+ verbal/tactile cues and hand over hand support w/ po intake and pt was unable to self-feed(wearing Mitts currently). Cognitive decline can increase risk for aspiration thus Pulmonary decline. Unsure of pt's full Cognitive status w/  regard to self-feeding at baseline. Also noted pt was Edentulous. Pt was positioned upright (needed assistance) and given trials of ice chips, Nectar liquids, and purees. Pt required feeding support - noted he often brought his Mitted hands to his mouth in attempts to "eat" at bite of food. Pt exhibited NO clinical s/s of aspiration w/ trials of ice chips and Nectar liquids via cup/straw. Pt was not given trials of thin liquids at this assessment d/t the degree of Cognitive decline and suspected increased risk for dysphagia/aspiration. Adequate bolus management and swallowing was noted w/ the trials given - clear vocal quality post trials, no decline in respiratory status during/post trials. Pt also consumed trials of puree w/ no overt s/s of aspiration noted. Oral phase appeared [facility] for bolus management, A-P transfer, and oral clearing post trials. Edentulous status. Again, d/t weakness and Mod+ Cognitive decline, pt was assisted w/ feeding of all trials but encouraged him to help SLP "hold" the cup when drinking. Informal OM exam revealed No unilateral lingual/labial weakness; no decreased ROM. Recommend modifying diet to a Dysphagia level 1 (puree) diet w/ NECTAR consistency liquids; aspiration precautions; Pills in Puree - Crushed as able. Feeding Support at all meals monitoring his toleration of diet consistency; reduce distractions during meals. Trials to upgrade diet as appropriate monitoring pt's Cognitive status and safety w/ increased consistencies in diet; management of solid foods d/t Edentulous status.  SLP Visit Diagnosis: Dysphagia, oropharyngeal phase (  R13.12)(impacted by Cognitive decline; edentulous status)    Aspiration Risk  Mild aspiration risk;Moderate aspiration risk;Risk for inadequate nutrition/hydration    Diet Recommendation  Dysphagia level 1 (puree) w/ NECTAR consistency liquids; aspiration precautions; feeding support and monitoring at all meals for toleration of  diet  Medication Administration: Crushed with puree(as able for safer, easier swallowing)    Other  Recommendations Recommended Consults: (dietician f/u) Oral Care Recommendations: Oral care BID;Staff/trained caregiver to provide oral care Other Recommendations: Order thickener from pharmacy;Prohibited food (jello, ice cream, thin soups);Remove water pitcher;Have oral suction available   Follow up Recommendations Skilled Nursing facility(TBD)      Frequency and Duration min 3x week  2 weeks       Prognosis Prognosis for Safe Diet Advancement: Fair(-Good) Barriers to Reach Goals: Cognitive deficits;Time post onset;Severity of deficits;Behavior      Swallow Study   General Date of Onset: 10/25/18 HPI: Pt is a 83 y.o. male with a history of advanced Dementia, CHF, chronic kidney disease who presents from home for insomnia and left foot swelling.  According to EMS patient has not slept in 3 days.  Patient has given him trazodone and Seroquel and patient is still not sleeping.  He has been very agitated and family has been unable to care for him.  They also noticed some swelling on his left foot.  In the ED, pt became agitated and required medication.  Initial laboratory evaluation was unremarkable except for urinalysis which was consistent with infection.  Pt is currently on a regular consistency diet.  Type of Study: Bedside Swallow Evaluation Previous Swallow Assessment: none reported Diet Prior to this Study: Regular;Thin liquids Temperature Spikes Noted: No(wbc 4.5) Respiratory Status: Room air History of Recent Intubation: No Behavior/Cognition: Cooperative;Pleasant mood;Confused;Impulsive;Distractible;Requires cueing;Doesn't follow directions(Awake) Oral Cavity Assessment: Dry Oral Care Completed by SLP: Recent completion by staff Oral Cavity - Dentition: Edentulous Vision: (cognitive impact) Self-Feeding Abilities: Total assist Patient Positioning: Upright in bed(needed  positioning) Baseline Vocal Quality: Normal(during few responses) Volitional Cough: Cognitively unable to elicit Volitional Swallow: Unable to elicit    Oral/Motor/Sensory Function Overall Oral Motor/Sensory Function: Within functional limits(grossly appeared - limited exam d/t cognitive status)   Ice Chips Ice chips: Within functional limits Presentation: Spoon(fed; 3 trials) Other Comments: but overall reduced attention to task requiring mod+ cues   Thin Liquid Thin Liquid: Not tested Other Comments: d/t cognitive decline    Nectar Thick Nectar Thick Liquid: Within functional limits Presentation: Cup;Self Fed;Straw(assisted to hold cup w/ SLP; ~4+ ozs)   Honey Thick Honey Thick Liquid: Not tested   Puree Puree: Within functional limits Presentation: Spoon(fed; 4+ ozs) Other Comments: magic cup and applesauce   Solid     Solid: Not tested Other Comments: edentulous       Orinda Kenner, MS, CCC-SLP Alanys Godino 10/26/2018,3:30 PM

## 2018-10-26 NOTE — Progress Notes (Signed)
Pt more alert at this time. Tolerating pureed meal well.

## 2018-10-26 NOTE — Progress Notes (Signed)
Arrived from ED via stretcher.  Drowsy but eyes open to stimuli.  Cooperative with staff at this time.

## 2018-10-26 NOTE — Progress Notes (Signed)
Patient ID: Jaime Liner Sr., male   DOB: [birth year], 83 y.o.   MRN: [id number]  [facility] PROGRESS NOTE  [name] HUD:[id number] DOB: 03/30/1936 DOA: 10/25/2018 PCP: Kirk Ruths, MD  HPI/Subjective: Patient looking at me and tries to talk but unable to do so.  Does not open his mouth.  Objective: Vitals:   10/26/18 0427 10/26/18 0752  BP: (!) 156/73 (!) 151/71  Pulse: 69 62  Resp:    Temp: 97.7 F (36.5 C) 97.7 F (36.5 C)  SpO2: 92% 100%    Intake/Output Summary (Last 24 hours) at 10/26/2018 1258 Last data filed at 10/26/2018 0501 Gross per 24 hour  Intake 136.54 ml  Output 0 ml  Net 136.54 ml   Filed Weights   10/25/18 2213 10/26/18 0431  Weight: 77.1 kg 57.7 kg    ROS: Review of Systems  Unable to perform ROS: Dementia   Exam: Physical Exam  Eyes: Pupils are equal, round, and reactive to light. Conjunctivae and lids are normal.  Neck: Carotid bruit is not present.  Cardiovascular: Regular rhythm, S1 normal and S2 normal.  Murmur heard.  Systolic murmur is present with a grade of 2/6. Respiratory: He has no decreased breath sounds. He has no wheezes. He has no rhonchi. He has no rales.  GI: Soft. Bowel sounds are normal. There is abdominal tenderness.  Musculoskeletal:     Right ankle: He exhibits swelling.     Left ankle: He exhibits swelling.  Neurological: He is alert.  Patient moves his arms on his own  Skin: Skin is warm. No rash noted. Nails show no clubbing.  Psychiatric:  Awake but cannot assess.      Data Reviewed: Basic Metabolic Panel: Recent Labs  Lab 10/25/18 2219  NA 140  K 3.5  CL 102  CO2 31  GLUCOSE 106*  BUN 25*  CREATININE 1.09  CALCIUM 8.3*   Liver Function Tests: Recent Labs  Lab 10/25/18 2219  AST 17  ALT 8  ALKPHOS 28*  BILITOT 0.3  PROT 6.2*  ALBUMIN 3.0*   CBC: Recent Labs  Lab 10/25/18 2219  WBC 4.5  NEUTROABS 1.9  HGB 11.1*  HCT 33.5*  MCV 96.8  PLT 128*   Cardiac  Enzymes: Recent Labs  Lab 10/25/18 2219  TROPONINI <0.03     Recent Results (from the past 240 hour(s))  SARS Coronavirus 2 (CEPHEID- Performed in [place] hospital lab), Hosp Order     Status: None   Collection Time: 10/26/18  1:40 AM  Result Value Ref Range Status   SARS Coronavirus 2 NEGATIVE NEGATIVE Final    Comment: (NOTE) If result is NEGATIVE SARS-CoV-2 target nucleic acids are NOT DETECTED. The SARS-CoV-2 RNA is generally detectable in upper and lower  respiratory specimens during the acute phase of infection. The lowest  concentration of SARS-CoV-2 viral copies this assay can detect is 250  copies / mL. A negative result does not preclude SARS-CoV-2 infection  and should not be used as the sole basis for treatment or other  patient management decisions.  A negative result may occur with  improper specimen collection / handling, submission of specimen other  than nasopharyngeal swab, presence of viral mutation(s) within the  areas targeted by this assay, and inadequate number of viral copies  (<250 copies / mL). A negative result must be combined with clinical  observations, patient history, and epidemiological information. If result is POSITIVE SARS-CoV-2 target nucleic acids are DETECTED. The SARS-CoV-2  RNA is generally detectable in upper and lower  respiratory specimens dur ing the acute phase of infection.  Positive  results are indicative of active infection with SARS-CoV-2.  Clinical  correlation with patient history and other diagnostic information is  necessary to determine patient infection status.  Positive results do  not rule out bacterial infection or co-infection with other viruses. If result is PRESUMPTIVE POSTIVE SARS-CoV-2 nucleic acids MAY BE PRESENT.   A presumptive positive result was obtained on the submitted specimen  and confirmed on repeat testing.  While 2019 novel coronavirus  (SARS-CoV-2) nucleic acids may be present in the submitted  sample  additional confirmatory testing may be necessary for epidemiological  and / or clinical management purposes  to differentiate between  SARS-CoV-2 and other Sarbecovirus currently known to infect humans.  If clinically indicated additional testing with an alternate test  methodology [phone]) is advised. The SARS-CoV-2 RNA is generally  detectable in upper and lower respiratory sp ecimens during the acute  phase of infection. The expected result is Negative. Fact Sheet for Patients:  StrictlyIdeas.no Fact Sheet for Healthcare Providers: BankingDealers.co.za This test is not yet approved or cleared by the [place] FDA and has been authorized for detection and/or diagnosis of SARS-CoV-2 by FDA under an Emergency Use Authorization (EUA).  This EUA will remain in effect (meaning this test can be used) for the duration of the COVID-19 declaration under Section 564(b)(1) of the Act, 21 U.S.C. section 360bbb-3(b)(1), unless the authorization is terminated or revoked sooner. Performed at [facility], [address] [place], [place] [address]      Studies: US Venous Img Lower Unilateral Left  Result Date: 10/26/2018 CLINICAL DATA:  Left leg swelling EXAM: Left LOWER EXTREMITY VENOUS DOPPLER ULTRASOUND TECHNIQUE: Gray-scale sonography with graded compression, as well as color Doppler and duplex ultrasound were performed to evaluate the lower extremity deep venous systems from the level of the common femoral vein and including the common femoral, femoral, profunda femoral, popliteal and calf veins including the posterior tibial, peroneal and gastrocnemius veins when visible. The superficial great saphenous vein was also interrogated. Spectral Doppler was utilized to evaluate flow at rest and with distal augmentation maneuvers in the common femoral, femoral and popliteal veins. COMPARISON:  None. FINDINGS: Contralateral Common  Femoral Vein: Respiratory phasicity is normal and symmetric with the symptomatic side. No evidence of thrombus. Normal compressibility. Common Femoral Vein: No evidence of thrombus. Normal compressibility, respiratory phasicity and response to augmentation. Saphenofemoral Junction: No evidence of thrombus. Normal compressibility and flow on color Doppler imaging. Profunda Femoral Vein: No evidence of thrombus. Normal compressibility and flow on color Doppler imaging. Femoral Vein: No evidence of thrombus. Normal compressibility, respiratory phasicity and response to augmentation. Popliteal Vein: No evidence of thrombus. Normal compressibility, respiratory phasicity and response to augmentation. Calf Veins: No evidence of thrombus. Normal compressibility and flow on color Doppler imaging. Superficial Great Saphenous Vein: No evidence of thrombus. Normal compressibility. Venous Reflux:  None. Other Findings:  None. IMPRESSION: No evidence of deep venous thrombosis. Electronically Signed   By: Ulyses Jarred M.D.   On: 10/26/2018 00:24   Dg Foot 2 Views Left  Result Date: 10/25/2018 CLINICAL DATA:  Left foot swelling. EXAM: LEFT FOOT - 2 VIEW COMPARISON:  None. FINDINGS: There is no evidence of fracture or dislocation. Pes cavus (high arches). Scattered osteoarthritis. There is no other evidence of arthropathy or other focal bone abnormality. There are vascular calcifications. Soft tissue edema overlying the dorsum of  the metatarsals IMPRESSION: Dorsal soft tissue edema. No acute osseous abnormality. Electronically Signed   By: Keith Rake M.D.   On: 10/25/2018 22:51    Scheduled Meds: . aspirin EC  81 mg Oral Daily  . divalproex  125 mg Oral BID  . docusate sodium  100 mg Oral BID  . donepezil  10 mg Oral QHS  . enoxaparin (LOVENOX) injection  40 mg Subcutaneous Q24H  . memantine  10 mg Oral BID  . QUEtiapine  50 mg Oral QHS   Continuous Infusions: . sodium chloride 75 mL/hr at 10/26/18 0431  .  [START ON 10/27/2018] cefTRIAXone (ROCEPHIN)  IV      Assessment/Plan:   1. Agitation and weakness.  Restart his normal Depakote and Seroquel.  Treat urinary tract infection 2. Acute cystitis.  Continue Rocephin 3. Alzheimer's dementia with agitation continue Seroquel and Depakote.  Continue Aricept and Namenda 4. Lower extremity edema.  Place TED hose.  Ultrasound the left lower extremity negative for DVT. 5. History of congestive heart failure.  Currently no signs. 6. Family spoke with nursing confirmed that he is a DO NOT RESUSCITATE.  I was unable to reach family this afternoon.  Code Status:     Code Status Orders  (From admission, onward)         Start     Ordered   10/26/18 0917  Do not attempt resuscitation (DNR)  Continuous    Question Answer Comment  In the event of cardiac or respiratory ARREST Do not call a "code blue"   In the event of cardiac or respiratory ARREST Do not perform Intubation, CPR, defibrillation or ACLS   In the event of cardiac or respiratory ARREST Use medication by any route, position, wound care, and other measures to relive pain and suffering. May use oxygen, suction and manual treatment of airway obstruction as needed for comfort.      10/26/18 2505        Code Status History    Date Active Date Inactive Code Status Order ID Comments User Context   10/26/2018 0424 10/26/2018 0917 Full Code [id number]  Harrie Foreman, MD Inpatient    Advance Directive Documentation     Most Recent Value  Type of Advance Directive  Healthcare Power of Attorney  Pre-existing out of facility DNR order (yellow form or pink MOST form)  -  "MOST" Form in Place?  -     Family Communication: Tried calling daughter and wife on the phone.  Unable to reach at this time Disposition Plan: To be determined  Antibiotics:  Rocephin  Time spent: 35 minutes  [place]

## 2018-10-26 NOTE — H&P (Signed)
Jaime Liner Sr. is an 83 y.o. male.   Chief Complaint: Agitation HPI: The patient with past medical history of dementia, CHF and CKD presents to the emergency department with his family due to agitation.  They report the patient has not slept in 3 days and has become increasingly aggressive and difficult to soothe or make comfortable.  The patient is unable to contribute to his history as he was initially belligerent prior to receiving sedation in the emergency department.  The patient's wife also reports that he has had leg swelling as well.  Initial laboratory evaluation was unremarkable except for urinalysis which was consistent with infection.  The patient was given a dose of ceftriaxone in the emergency department prior to the hospitalist service being called for admission.  Past Medical History:  Diagnosis Date  . Allergy   . Back problem   . Cancer ([place]) 2006, 2013   nose, left ear  . CHF (congestive heart failure) ([place])   . Chronic kidney disease   . Dementia ([place])   . Joint ache     Past Surgical History:  Procedure Laterality Date  . BACK SURGERY    . EXTERNAL EAR SURGERY Left 2013   cancer  . HERNIA REPAIR     left inguinal x 2 right inguinal x 1  . HERNIA REPAIR Right 07/09/14   Bard onlay polypropylene mesh for recurrent hernia repair  . KNEE SURGERY      History reviewed. No pertinent family history. Social History:  reports that he quit smoking about 45 years ago. His smoking use included cigarettes. He has a 40.00 pack-year smoking history. He has never used smokeless tobacco. He reports that he does not drink alcohol or use drugs.  Allergies:  Allergies  Allergen Reactions  . Aspirin   . Bee Pollen     Bee stings  . Darvon [Propoxyphene] Other (See Comments)    hallucinations  . Epinephrine Other (See Comments)    hallucinate  . Penicillins Swelling  . Sulfa Antibiotics Swelling  . Xylocaine [Lidocaine Hcl] Other (See Comments)    hallucinate     (Not in a hospital admission)   Results for orders placed or performed during the hospital encounter of 10/25/18 (from the past 48 hour(s))  CBC with Differential/Platelet     Status: Abnormal   Collection Time: 10/25/18 10:19 PM  Result Value Ref Range   WBC 4.5 4.0 - 10.5 K/uL   RBC 3.46 (L) 4.22 - 5.81 MIL/uL   Hemoglobin 11.1 (L) 13.0 - 17.0 g/dL   HCT 33.5 (L) 39.0 - 52.0 %   MCV 96.8 80.0 - 100.0 fL   MCH 32.1 26.0 - 34.0 pg   MCHC 33.1 30.0 - 36.0 g/dL   RDW 13.3 11.5 - 15.5 %   Platelets 128 (L) 150 - 400 K/uL   nRBC 0.0 0.0 - 0.2 %   Neutrophils Relative % 42 %   Neutro Abs 1.9 1.7 - 7.7 K/uL   Lymphocytes Relative 35 %   Lymphs Abs 1.6 0.7 - 4.0 K/uL   Monocytes Relative 12 %   Monocytes Absolute 0.5 0.1 - 1.0 K/uL   Eosinophils Relative 9 %   Eosinophils Absolute 0.4 0.0 - 0.5 K/uL   Basophils Relative 1 %   Basophils Absolute 0.1 0.0 - 0.1 K/uL   Immature Granulocytes 1 %   Abs Immature Granulocytes 0.03 0.00 - 0.07 K/uL    Comment: Performed at [facility], [address]  [place], [place] [address]  Comprehensive metabolic panel     Status: Abnormal   Collection Time: 10/25/18 10:19 PM  Result Value Ref Range   Sodium 140 135 - 145 mmol/L   Potassium 3.5 3.5 - 5.1 mmol/L   Chloride 102 98 - 111 mmol/L   CO2 31 22 - 32 mmol/L   Glucose, Bld 106 (H) 70 - 99 mg/dL   BUN 25 (H) 8 - 23 mg/dL   Creatinine, Ser 1.09 0.61 - 1.24 mg/dL   Calcium 8.3 (L) 8.9 - 10.3 mg/dL   Total Protein 6.2 (L) 6.5 - 8.1 g/dL   Albumin 3.0 (L) 3.5 - 5.0 g/dL   AST 17 15 - 41 U/L   ALT 8 0 - 44 U/L   Alkaline Phosphatase 28 (L) 38 - 126 U/L   Total Bilirubin 0.3 0.3 - 1.2 mg/dL   GFR calc non Af Amer >60 >60 mL/min   GFR calc Af Amer >60 >60 mL/min   Anion gap 7 5 - 15    Comment: Performed at [facility], [address] [place], [place] [address]  Troponin I - ONCE - STAT     Status: None   Collection Time: 10/25/18 10:19 PM  Result Value Ref Range    Troponin I <0.03 <0.03 ng/mL    Comment: Performed at [facility], [address] [place], [place] [address]  Urinalysis, Complete w Microscopic     Status: Abnormal   Collection Time: 10/25/18 11:09 PM  Result Value Ref Range   Color, Urine YELLOW (A) YELLOW   APPearance CLOUDY (A) CLEAR   Specific Gravity, Urine 1.021 1.005 - 1.030   pH 6.0 5.0 - 8.0   Glucose, UA NEGATIVE NEGATIVE mg/dL   Hgb urine dipstick SMALL (A) NEGATIVE   Bilirubin Urine NEGATIVE NEGATIVE   Ketones, ur 5 (A) NEGATIVE mg/dL   Protein, ur 100 (A) NEGATIVE mg/dL   Nitrite NEGATIVE NEGATIVE   Leukocytes,Ua LARGE (A) NEGATIVE   RBC / HPF 11-20 0 - 5 RBC/hpf   WBC, UA >50 (H) 0 - 5 WBC/hpf   Bacteria, UA NONE SEEN NONE SEEN   Squamous Epithelial / LPF 0-5 0 - 5   Mucus PRESENT    Non Squamous Epithelial PRESENT (A) NONE SEEN    Comment: Performed at [facility], [address] [place],  [address]  SARS Coronavirus 2 (CEPHEID- Performed in [place] hospital lab), Hosp Order     Status: None   Collection Time: 10/26/18  1:40 AM  Result Value Ref Range   SARS Coronavirus 2 NEGATIVE NEGATIVE    Comment: (NOTE) If result is NEGATIVE SARS-CoV-2 target nucleic acids are NOT DETECTED. The SARS-CoV-2 RNA is generally detectable in upper and lower  respiratory specimens during the acute phase of infection. The lowest  concentration of SARS-CoV-2 viral copies this assay can detect is 250  copies / mL. A negative result does not preclude SARS-CoV-2 infection  and should not be used as the sole basis for treatment or other  patient management decisions.  A negative result may occur with  improper specimen collection / handling, submission of specimen other  than nasopharyngeal swab, presence of viral mutation(s) within the  areas targeted by this assay, and inadequate number of viral copies  (<250 copies / mL). A negative result must be combined with clinical  observations, patient  history, and epidemiological information. If result is POSITIVE SARS-CoV-2 target nucleic acids are DETECTED. The SARS-CoV-2 RNA is  generally detectable in upper and lower  respiratory specimens dur ing the acute phase of infection.  Positive  results are indicative of active infection with SARS-CoV-2.  Clinical  correlation with patient history and other diagnostic information is  necessary to determine patient infection status.  Positive results do  not rule out bacterial infection or co-infection with other viruses. If result is PRESUMPTIVE POSTIVE SARS-CoV-2 nucleic acids MAY BE PRESENT.   A presumptive positive result was obtained on the submitted specimen  and confirmed on repeat testing.  While 2019 novel coronavirus  (SARS-CoV-2) nucleic acids may be present in the submitted sample  additional confirmatory testing may be necessary for epidemiological  and / or clinical management purposes  to differentiate between  SARS-CoV-2 and other Sarbecovirus currently known to infect humans.  If clinically indicated additional testing with an alternate test  methodology [phone]) is advised. The SARS-CoV-2 RNA is generally  detectable in upper and lower respiratory sp ecimens during the acute  phase of infection. The expected result is Negative. Fact Sheet for Patients:  StrictlyIdeas.no Fact Sheet for Healthcare Providers: BankingDealers.co.za This test is not yet approved or cleared by the [place] FDA and has been authorized for detection and/or diagnosis of SARS-CoV-2 by FDA under an Emergency Use Authorization (EUA).  This EUA will remain in effect (meaning this test can be used) for the duration of the COVID-19 declaration under Section 564(b)(1) of the Act, 21 U.S.C. section 360bbb-3(b)(1), unless the authorization is terminated or revoked sooner. Performed at [facility], [address] [place], [place]  [address]    US Venous Img Lower Unilateral Left  Result Date: 10/26/2018 CLINICAL DATA:  Left leg swelling EXAM: Left LOWER EXTREMITY VENOUS DOPPLER ULTRASOUND TECHNIQUE: Gray-scale sonography with graded compression, as well as color Doppler and duplex ultrasound were performed to evaluate the lower extremity deep venous systems from the level of the common femoral vein and including the common femoral, femoral, profunda femoral, popliteal and calf veins including the posterior tibial, peroneal and gastrocnemius veins when visible. The superficial great saphenous vein was also interrogated. Spectral Doppler was utilized to evaluate flow at rest and with distal augmentation maneuvers in the common femoral, femoral and popliteal veins. COMPARISON:  None. FINDINGS: Contralateral Common Femoral Vein: Respiratory phasicity is normal and symmetric with the symptomatic side. No evidence of thrombus. Normal compressibility. Common Femoral Vein: No evidence of thrombus. Normal compressibility, respiratory phasicity and response to augmentation. Saphenofemoral Junction: No evidence of thrombus. Normal compressibility and flow on color Doppler imaging. Profunda Femoral Vein: No evidence of thrombus. Normal compressibility and flow on color Doppler imaging. Femoral Vein: No evidence of thrombus. Normal compressibility, respiratory phasicity and response to augmentation. Popliteal Vein: No evidence of thrombus. Normal compressibility, respiratory phasicity and response to augmentation. Calf Veins: No evidence of thrombus. Normal compressibility and flow on color Doppler imaging. Superficial Great Saphenous Vein: No evidence of thrombus. Normal compressibility. Venous Reflux:  None. Other Findings:  None. IMPRESSION: No evidence of deep venous thrombosis. Electronically Signed   By: Ulyses Jarred M.D.   On: 10/26/2018 00:24   Dg Foot 2 Views Left  Result Date: 10/25/2018 CLINICAL DATA:  Left foot swelling. EXAM: LEFT FOOT  - 2 VIEW COMPARISON:  None. FINDINGS: There is no evidence of fracture or dislocation. Pes cavus (high arches). Scattered osteoarthritis. There is no other evidence of arthropathy or other focal bone abnormality. There are vascular calcifications. Soft tissue edema overlying the dorsum of the metatarsals IMPRESSION: Dorsal soft  tissue edema. No acute osseous abnormality. Electronically Signed   By: Keith Rake M.D.   On: 10/25/2018 22:51    Review of Systems  Unable to perform ROS: Mental status change  Cardiovascular: Positive for leg swelling.  Psychiatric/Behavioral: The patient has insomnia.     Blood pressure (!) 113/48, pulse 62, temperature 97.9 F (36.6 C), temperature source Oral, resp. rate 20, height 5\' 8"  (1.727 m), weight 77.1 kg, SpO2 93 %. Physical Exam  Vitals reviewed. Constitutional: He appears well-developed and well-nourished. No distress.  HENT:  Head: Normocephalic and atraumatic.  Mouth/Throat: Oropharynx is clear and moist.  Eyes: Pupils are equal, round, and reactive to light. Conjunctivae and EOM are normal. No scleral icterus.  Neck: Normal range of motion. Neck supple. No JVD present. No tracheal deviation present. No thyromegaly present.  Cardiovascular: Normal rate, regular rhythm and normal heart sounds. Exam reveals no gallop and no friction rub.  No murmur heard. Respiratory: Effort normal and breath sounds normal. No respiratory distress.  GI: Soft. Bowel sounds are normal. He exhibits no distension. There is no abdominal tenderness.  Genitourinary:    Genitourinary Comments: Deferred   Musculoskeletal: Normal range of motion.        General: No edema.  Lymphadenopathy:    He has no cervical adenopathy.  Neurological: No cranial nerve deficit.  Skin: Skin is warm and dry. No rash noted. No erythema.  Psychiatric:  Patient is sedated and sleeping deeply     Assessment/Plan This is an 83 year old male admitted for weakness. 1.  Weakness:  Multifactorial including intravascular volume depletion, less than optimal p.o. intake and urinary tract infection.  Insomnia also contributing.  PT/OT evaluation prior to discharge. 2.  UTI: Continue ceftriaxone.  Follow urine culture for growth and sensitivities.  The patient does not meet criteria for sepsis at this time. 3.  Dementia: Alzheimer's type; apparently the patient's baseline behavior is aggressive but that he has become more agitated within the last 3 days.  This is likely owing to insomnia which is in part due to progressive dementia.  May also be secondary to urine retention.  Check bladder volumes if inconsistent urine output.  Continue Namenda and Remeron 4.  CHF: Clinically apparent due to lower extremity swelling.  However I do not find any documentation from cardiology nor is her echo report.  The patient's lungs are clear and he does not have an oxygen requirement.  Lower extremity edema may be secondary to poor venous return and or diastolic dysfunction.  Diuretic therapy or further work-up at the discretion of primary team. 5.  DVT prophylaxis: Lovenox 6.  GI prophylaxis: None The patient is a full code at this time.  (The patient's daughter reports that he had indicated DNR status at some point.  The his wife is going to clarify advanced directives and bring in living well if available).  Harrie Foreman, MD 10/26/2018, 4:06 AM

## 2018-10-26 NOTE — ED Notes (Signed)
ED TO INPATIENT HANDOFF REPORT  ED Nurse Name and Phone #:  Tatum Massman [phone]  S Name/Age/Gender Jaime Villegas. 83 y.o. male Room/Bed: ED15A/ED15A  Code Status   Code Status: Not on file  Home/SNF/Other Home Patient oriented to: self Is this baseline? Yes   Triage Complete: Triage complete  Chief Complaint AMS  Triage Note Pt from home via [place]. Per EMS, pt's family pt has a hx of dementia; pt unable to sleep for 3x days; diarrhea for 2 days; left leg swelling. Pt has a Rx for trazodone,per family, pt taking medication as prescribed but pt unable to sleep to 3 days. NAD noted upon arrival. Pt is calm at this time.      Allergies Allergies  Allergen Reactions  . Aspirin   . Bee Pollen     Bee stings  . Darvon [Propoxyphene] Other (See Comments)    hallucinations  . Epinephrine Other (See Comments)    hallucinate  . Penicillins Swelling  . Sulfa Antibiotics Swelling  . Xylocaine [Lidocaine Hcl] Other (See Comments)    hallucinate    Level of Care/Admitting Diagnosis ED Disposition    ED Disposition Condition [facility] Area: [place] [100120]  Level of Care: Med-Surg [16]  Covid Evaluation: Screening Protocol (No Symptoms)  Diagnosis: Weakness [193790]  Admitting Physician: Harrie Foreman [[id number]]  Attending Physician: Harrie Foreman [phone]  PT Class (Do Not Modify): Observation [104]  PT Acc Code (Do Not Modify): Observation [10022]       B Medical/Surgery History Past Medical History:  Diagnosis Date  . Allergy   . Back problem   . Cancer ([place]) 2006, 2013   nose, left ear  . CHF (congestive heart failure) ([place])   . Chronic kidney disease   . Dementia ([place])   . Joint ache    Past Surgical History:  Procedure Laterality Date  . BACK SURGERY    . EXTERNAL EAR SURGERY Left 2013   cancer  . HERNIA REPAIR     left inguinal x 2 right inguinal x 1  . HERNIA REPAIR Right 07/09/14   Bard onlay  polypropylene mesh for recurrent hernia repair  . KNEE SURGERY       A IV Location/Drains/Wounds Patient Lines/Drains/Airways Status   Active Line/Drains/Airways    Name:   Placement date:   Placement time:   Site:   Days:   Peripheral IV 10/25/18 Right Antecubital   10/25/18    2228    Antecubital   1          Intake/Output Last 24 hours No intake or output data in the 24 hours ending 10/26/18 0317  Labs/Imaging Results for orders placed or performed during the hospital encounter of 10/25/18 (from the past 48 hour(s))  CBC with Differential/Platelet     Status: Abnormal   Collection Time: 10/25/18 10:19 PM  Result Value Ref Range   WBC 4.5 4.0 - 10.5 K/uL   RBC 3.46 (L) 4.22 - 5.81 MIL/uL   Hemoglobin 11.1 (L) 13.0 - 17.0 g/dL   HCT 33.5 (L) 39.0 - 52.0 %   MCV 96.8 80.0 - 100.0 fL   MCH 32.1 26.0 - 34.0 pg   MCHC 33.1 30.0 - 36.0 g/dL   RDW 13.3 11.5 - 15.5 %   Platelets 128 (L) 150 - 400 K/uL   nRBC 0.0 0.0 - 0.2 %   Neutrophils Relative % 42 %   Neutro Abs 1.9 1.7 -  7.7 K/uL   Lymphocytes Relative 35 %   Lymphs Abs 1.6 0.7 - 4.0 K/uL   Monocytes Relative 12 %   Monocytes Absolute 0.5 0.1 - 1.0 K/uL   Eosinophils Relative 9 %   Eosinophils Absolute 0.4 0.0 - 0.5 K/uL   Basophils Relative 1 %   Basophils Absolute 0.1 0.0 - 0.1 K/uL   Immature Granulocytes 1 %   Abs Immature Granulocytes 0.03 0.00 - 0.07 K/uL    Comment: Performed at [facility], [address] [place], [place] [address]  Comprehensive metabolic panel     Status: Abnormal   Collection Time: 10/25/18 10:19 PM  Result Value Ref Range   Sodium 140 135 - 145 mmol/L   Potassium 3.5 3.5 - 5.1 mmol/L   Chloride 102 98 - 111 mmol/L   CO2 31 22 - 32 mmol/L   Glucose, Bld 106 (H) 70 - 99 mg/dL   BUN 25 (H) 8 - 23 mg/dL   Creatinine, Ser 1.09 0.61 - 1.24 mg/dL   Calcium 8.3 (L) 8.9 - 10.3 mg/dL   Total Protein 6.2 (L) 6.5 - 8.1 g/dL   Albumin 3.0 (L) 3.5 - 5.0 g/dL   AST 17 15 - 41 U/L    ALT 8 0 - 44 U/L   Alkaline Phosphatase 28 (L) 38 - 126 U/L   Total Bilirubin 0.3 0.3 - 1.2 mg/dL   GFR calc non Af Amer >60 >60 mL/min   GFR calc Af Amer >60 >60 mL/min   Anion gap 7 5 - 15    Comment: Performed at [facility], [address] [place], [place] [address]  Troponin I - ONCE - STAT     Status: None   Collection Time: 10/25/18 10:19 PM  Result Value Ref Range   Troponin I <0.03 <0.03 ng/mL    Comment: Performed at [facility], [address] [place], [place] [address]  Urinalysis, Complete w Microscopic     Status: Abnormal   Collection Time: 10/25/18 11:09 PM  Result Value Ref Range   Color, Urine YELLOW (A) YELLOW   APPearance CLOUDY (A) CLEAR   Specific Gravity, Urine 1.021 1.005 - 1.030   pH 6.0 5.0 - 8.0   Glucose, UA NEGATIVE NEGATIVE mg/dL   Hgb urine dipstick SMALL (A) NEGATIVE   Bilirubin Urine NEGATIVE NEGATIVE   Ketones, ur 5 (A) NEGATIVE mg/dL   Protein, ur 100 (A) NEGATIVE mg/dL   Nitrite NEGATIVE NEGATIVE   Leukocytes,Ua LARGE (A) NEGATIVE   RBC / HPF 11-20 0 - 5 RBC/hpf   WBC, UA >50 (H) 0 - 5 WBC/hpf   Bacteria, UA NONE SEEN NONE SEEN   Squamous Epithelial / LPF 0-5 0 - 5   Mucus PRESENT    Non Squamous Epithelial PRESENT (A) NONE SEEN    Comment: Performed at [facility], [address], [place], [place] [address]  SARS Coronavirus 2 (CEPHEID- Performed in [facility] hospital lab), Hosp Order     Status: None   Collection Time: 10/26/18  1:40 AM  Result Value Ref Range   SARS Coronavirus 2 NEGATIVE NEGATIVE    Comment: (NOTE) If result is NEGATIVE SARS-CoV-2 target nucleic acids are NOT DETECTED. The SARS-CoV-2 RNA is generally detectable in upper and lower  respiratory specimens during the acute phase of infection. The lowest  concentration of SARS-CoV-2 viral copies this assay can detect is 250  copies / mL. A negative result does not preclude SARS-CoV-2 infection  and should not be used as the sole basis  for treatment or other  patient management decisions.  A negative result may occur with  improper specimen collection / handling, submission of specimen other  than nasopharyngeal swab, presence of viral mutation(s) within the  areas targeted by this assay, and inadequate number of viral copies  (<250 copies / mL). A negative result must be combined with clinical  observations, patient history, and epidemiological information. If result is POSITIVE SARS-CoV-2 target nucleic acids are DETECTED. The SARS-CoV-2 RNA is generally detectable in upper and lower  respiratory specimens dur ing the acute phase of infection.  Positive  results are indicative of active infection with SARS-CoV-2.  Clinical  correlation with patient history and other diagnostic information is  necessary to determine patient infection status.  Positive results do  not rule out bacterial infection or co-infection with other viruses. If result is PRESUMPTIVE POSTIVE SARS-CoV-2 nucleic acids MAY BE PRESENT.   A presumptive positive result was obtained on the submitted specimen  and confirmed on repeat testing.  While 2019 novel coronavirus  (SARS-CoV-2) nucleic acids may be present in the submitted sample  additional confirmatory testing may be necessary for epidemiological  and / or clinical management purposes  to differentiate between  SARS-CoV-2 and other Sarbecovirus currently known to infect humans.  If clinically indicated additional testing with an alternate test  methodology [phone]) is advised. The SARS-CoV-2 RNA is generally  detectable in upper and lower respiratory sp ecimens during the acute  phase of infection. The expected result is Negative. Fact Sheet for Patients:  StrictlyIdeas.no Fact Sheet for Healthcare Providers: BankingDealers.co.za This test is not yet approved or cleared by the [place] FDA and has been authorized for detection and/or  diagnosis of SARS-CoV-2 by FDA under an Emergency Use Authorization (EUA).  This EUA will remain in effect (meaning this test can be used) for the duration of the COVID-19 declaration under Section 564(b)(1) of the Act, 21 U.S.C. section 360bbb-3(b)(1), unless the authorization is terminated or revoked sooner. Performed at [facility], [address] [place], [place] [address]    US Venous Img Lower Unilateral Left  Result Date: 10/26/2018 CLINICAL DATA:  Left leg swelling EXAM: Left LOWER EXTREMITY VENOUS DOPPLER ULTRASOUND TECHNIQUE: Gray-scale sonography with graded compression, as well as color Doppler and duplex ultrasound were performed to evaluate the lower extremity deep venous systems from the level of the common femoral vein and including the common femoral, femoral, profunda femoral, popliteal and calf veins including the posterior tibial, peroneal and gastrocnemius veins when visible. The superficial great saphenous vein was also interrogated. Spectral Doppler was utilized to evaluate flow at rest and with distal augmentation maneuvers in the common femoral, femoral and popliteal veins. COMPARISON:  None. FINDINGS: Contralateral Common Femoral Vein: Respiratory phasicity is normal and symmetric with the symptomatic side. No evidence of thrombus. Normal compressibility. Common Femoral Vein: No evidence of thrombus. Normal compressibility, respiratory phasicity and response to augmentation. Saphenofemoral Junction: No evidence of thrombus. Normal compressibility and flow on color Doppler imaging. Profunda Femoral Vein: No evidence of thrombus. Normal compressibility and flow on color Doppler imaging. Femoral Vein: No evidence of thrombus. Normal compressibility, respiratory phasicity and response to augmentation. Popliteal Vein: No evidence of thrombus. Normal compressibility, respiratory phasicity and response to augmentation. Calf Veins: No evidence of thrombus. Normal  compressibility and flow on color Doppler imaging. Superficial Great Saphenous Vein: No evidence of thrombus. Normal compressibility. Venous Reflux:  None. Other Findings:  None. IMPRESSION: No evidence of deep venous thrombosis. Electronically Signed   By: Ulyses Jarred M.D.   On: 10/26/2018 00:24   Dg Foot 2 Views Left  Result Date: 10/25/2018 CLINICAL DATA:  Left foot swelling. EXAM: LEFT FOOT - 2 VIEW COMPARISON:  None. FINDINGS: There is no evidence of fracture or dislocation. Pes cavus (high arches). Scattered osteoarthritis. There is no other evidence of arthropathy or other focal bone abnormality. There are vascular calcifications. Soft tissue edema overlying the dorsum of the metatarsals IMPRESSION: Dorsal soft tissue edema. No acute osseous abnormality. Electronically Signed   By: Keith Rake M.D.   On: 10/25/2018 22:51    Pending Labs Unresulted Labs (From admission, onward)    Start     Ordered   10/26/18 0021  Urine culture  Add-on,   AD     10/26/18 0020   Signed and Held  Creatinine, serum  (enoxaparin (LOVENOX)    CrCl >/= 30 ml/min)  Weekly,   R    Comments:  while on enoxaparin therapy    Signed and Held   Signed and Held  TSH  Add-on,   R     Signed and Held          Vitals/Pain Today's Vitals   10/26/18 0130 10/26/18 0200 10/26/18 0230 10/26/18 0300  BP: 116/75 140/63 (!) 142/70 137/62  Pulse: 65  77   Resp: 20     Temp:      TempSrc:      SpO2: 100%  100%   Weight:      Height:      PainSc: Asleep       Isolation Precautions Droplet and Contact precautions  Medications Medications  LORazepam (ATIVAN) 2 MG/ML injection (1 mg  Given 10/25/18 2329)  cefTRIAXone (ROCEPHIN) 1 g in sodium chloride 0.9 % 100 mL IVPB (0 g Intravenous Stopped 10/26/18 0308)    Mobility walks with device High fall risk   Focused Assessments n/a   R Recommendations: See Admitting Provider Note  Report given to:   Additional Notes: pt is irritable and can be  violent, HOH, dementia, will attempt to get out of bed.

## 2018-10-26 NOTE — Progress Notes (Addendum)
Initial Nutrition Assessment  DOCUMENTATION CODES:   Not applicable  INTERVENTION:   Nepro Shake po BID, each supplement provides 425 kcal and 19 grams protein  Magic cup TID with meals, each supplement provides 290 kcal and 9 grams of protein  NUTRITION DIAGNOSIS:   Inadequate oral intake related to chronic illness(dementia ) as evidenced by other (comment)(per chart review ).  GOAL:   Patient will meet greater than or equal to 90% of their needs  MONITOR:   PO intake, Supplement acceptance, Labs, Weight trends, I & O's, Skin  REASON FOR ASSESSMENT:   Malnutrition Screening Tool    ASSESSMENT:   83 y/o male with past medical history of dementia, CHF and CKD presents to the emergency department with his family due to agitation.  RD working remotely.  Unable to speak with pt r/t dementia. Suspect pt with poor appetite and oral intake pta r/t disease progression. Per chart, pt has lost 10lbs(7%) over the past 6 months; this is not significant but worth noting. Pt seen by SLP and placed on dysphagia 1/nectar thick diet. RD will add supplements to help pt meet his estimated needs.   Pt at high risk for malnutrition but unable to diagnose at this time as NPFE can not be performed.   Medications reviewed and include: aspirin, colace, lovenox, NaCl @75ml /hr, ceftriaxone   Labs reviewed: BUN 25(H)  Unable to complete Nutrition-Focused physical exam at this time.   Diet Order:   Diet Order            DIET - DYS 1 Room service appropriate? Yes with Assist; Fluid consistency: Nectar Thick  Diet effective now             EDUCATION NEEDS:   Not appropriate for education at this time  Skin:  Skin Assessment: Reviewed RN Assessment  Last BM:  pta  Height:   Ht Readings from Last 1 Encounters:  10/26/18 5\' 8"  (1.727 m)    Weight:   Wt Readings from Last 1 Encounters:  10/26/18 57.7 kg    Ideal Body Weight:  70 kg  BMI:  Body mass index is 19.34  kg/m.  Estimated Nutritional Needs:   Kcal:  1700-1900kcal/day   Protein:  85-95g/day   Fluid:  >1.4L/day   Koleen Distance MS, RD, LDN Pager #- [phone] Office#- [phone] After Hours Pager: [phone]

## 2018-10-26 NOTE — Progress Notes (Signed)
Pt resting calmly in bed at this time. Pt refuses supper at this time. Drinking dietary supplement with encouragement.

## 2018-10-26 NOTE — ED Notes (Signed)
Daughter asking to leave now that pt is asleep. Advised daughter that someone needs to take him home and that he will not understand d/c instructions. Daughter states the last time he had to be carried in the house. Asked if pt walked at home. Daughter replied yes.

## 2018-10-27 LAB — URINE CULTURE: Culture: >=100000 — AB

## 2018-10-27 MED ORDER — DOCUSATE SODIUM 50 MG/5ML PO LIQD
100.0000 mg | Freq: Two times a day (BID) | ORAL | Status: DC
  Administered 2018-10-27 – 2018-10-30 (×5): 100 mg via ORAL
  Filled 2018-10-27 (×11): qty 10

## 2018-10-27 MED ORDER — CIPROFLOXACIN HCL 500 MG PO TABS: 500.0000 mg | ORAL_TABLET | Freq: Two times a day (BID) | ORAL | 0 refills | Status: DC

## 2018-10-27 MED ORDER — DIVALPROEX SODIUM 125 MG PO CSDR
125.0000 mg | DELAYED_RELEASE_CAPSULE | Freq: Two times a day (BID) | ORAL | Status: DC
  Administered 2018-10-27 – 2018-10-30 (×5): 125 mg via ORAL
  Filled 2018-10-27 (×7): qty 1

## 2018-10-27 NOTE — Progress Notes (Signed)
  Speech Language Pathology Treatment: Dysphagia  Patient Details Name: Jaime BAYONA Sr. MRN: [id number] DOB: 08/30/1935 Today's Date: 10/27/2018 Time: 0375-4360 SLP Time Calculation (min) (ACUTE ONLY): 28 min  Assessment / Plan / Recommendation Clinical Impression  The patient was seen for ongoing assessment of swallow safety and potential for diet upgrade.  The patient remains poorly oriented to the task of eating.  He keeps his eyes closed, opening occasionally to voice.  The patient had not eaten much of his lunch.  He took a Psychologist, prison and probation services with no clinical indicators of aspiration and minimally slowed oral transit time.  Due to his decreased arousal and awareness of his environment, trials of upgraded consistencies were deferred for patient safety.  Recommend continuing current diet, dysphagia 1 (puree) with nectar-thick liquids.  SLP will continue to monitor for readiness for diet upgrade.     HPI HPI: Pt is a 83 y.o. male with a history of advanced Dementia, CHF, chronic kidney disease who presents from home for insomnia and left foot swelling.  According to EMS patient has not slept in 3 days.  Patient has given him trazodone and Seroquel and patient is still not sleeping.  He has been very agitated and family has been unable to care for him.  They also noticed some swelling on his left foot.  In the ED, pt became agitated and required medication.  Initial laboratory evaluation was unremarkable except for urinalysis which was consistent with infection.  Pt is currently on a regular consistency diet.       SLP Plan  Continue with current plan of care       Recommendations  Diet recommendations: Dysphagia 1 (puree);Nectar-thick liquid Liquids provided via: Cup Medication Administration: Crushed with puree Supervision: Trained caregiver to feed patient Compensations: Minimize environmental distractions;Slow rate;Small sips/bites;Lingual sweep for clearance of  pocketing Postural Changes and/or Swallow Maneuvers: Seated upright 90 degrees;Upright 30-60 min after meal                Oral Care Recommendations: Oral care BID;Staff/trained caregiver to provide oral care Follow up Recommendations: Skilled Nursing facility SLP Visit Diagnosis: Dysphagia, oropharyngeal phase (R13.12) Plan: Continue with current plan of care       [place], [place], Susie 10/27/2018, 2:38 PM

## 2018-10-27 NOTE — Plan of Care (Signed)
  Problem: Nutrition: Goal: Adequate nutrition will be maintained Outcome: Progressing   Problem: Safety: Goal: Ability to remain free from injury will improve Outcome: Not Progressing   Problem: Skin Integrity: Goal: Risk for impaired skin integrity will decrease Outcome: Progressing

## 2018-10-27 NOTE — Progress Notes (Signed)
Patient ID: Jaime Liner Sr., male   DOB: 1936-02-15, 83 y.o.   MRN: [id number]  [facility] PROGRESS NOTE  Chipper Koudelka NAT:[id number] DOB: 03-06-1936 DOA: 10/25/2018 PCP: Kirk Ruths, MD  HPI/Subjective: Patient is agitated.  Objective: Vitals:   10/27/18 0525 10/27/18 0724  BP: 137/80 (!) 144/93  Pulse: 86 92  Resp:    Temp: 98.1 F (36.7 C) (!) 97.3 F (36.3 C)  SpO2: 100% 97%    Intake/Output Summary (Last 24 hours) at 10/27/2018 1657 Last data filed at 10/27/2018 4270 Gross per 24 hour  Intake 300 ml  Output 800 ml  Net -500 ml   Filed Weights   10/26/18 0431 10/27/18 0040 10/27/18 0110  Weight: 57.7 kg 57.6 kg 57.6 kg    ROS: Review of Systems  Unable to perform ROS: Dementia   Exam: Physical Exam  Eyes: Pupils are equal, round, and reactive to light. Conjunctivae and lids are normal.  Neck: Carotid bruit is not present.  Cardiovascular: Regular rhythm, S1 normal and S2 normal.  Murmur heard.  Systolic murmur is present with a grade of 2/6. Respiratory: He has no decreased breath sounds. He has no wheezes. He has no rhonchi. He has no rales.  GI: Soft. Bowel sounds are normal. He exhibits no distension.  Genitourinary:    Genitourinary Comments: Nephrostomy tube in situ.   Musculoskeletal:     Right ankle: He exhibits swelling.     Left ankle: He exhibits swelling.  Neurological: He is alert.  Patient moves his arms on his own  Skin: Skin is warm. No rash noted. Nails show no clubbing.  Psychiatric:  Awake but cannot assess.      Data Reviewed: Basic Metabolic Panel: Recent Labs  Lab 10/25/18 2219  NA 140  K 3.5  CL 102  CO2 31  GLUCOSE 106*  BUN 25*  CREATININE 1.09  CALCIUM 8.3*   Liver Function Tests: Recent Labs  Lab 10/25/18 2219  AST 17  ALT 8  ALKPHOS 28*  BILITOT 0.3  PROT 6.2*  ALBUMIN 3.0*   CBC: Recent Labs  Lab 10/25/18 2219  WBC 4.5  NEUTROABS 1.9  HGB 11.1*  HCT 33.5*  MCV 96.8  PLT  128*   Cardiac Enzymes: Recent Labs  Lab 10/25/18 2219  TROPONINI <0.03     Recent Results (from the past 240 hour(s))  Urine culture     Status: Abnormal   Collection Time: 10/25/18 11:09 PM  Result Value Ref Range Status   Specimen Description   Final    URINE, RANDOM Performed at [facility], [address], [place], [place] [address]    Special Requests   Final    NONE Performed at [facility], [address], [place], [place] [address]    Culture (A)  Final    >=100,000 COLONIES/mL DIPHTHEROIDS(CORYNEBACTERIUM SPECIES) Standardized susceptibility testing for this organism is not available. Performed at [facility], [place] [address], [place], [place] [address]    Report Status 10/27/2018 FINAL  Final  SARS Coronavirus 2 (CEPHEID- Performed in [place] hospital lab), Hosp Order     Status: None   Collection Time: 10/26/18  1:40 AM  Result Value Ref Range Status   SARS Coronavirus 2 NEGATIVE NEGATIVE Final    Comment: (NOTE) If result is NEGATIVE SARS-CoV-2 target nucleic acids are NOT DETECTED. The SARS-CoV-2 RNA is generally detectable in upper and lower  respiratory specimens during the acute phase of infection.  The lowest  concentration of SARS-CoV-2 viral copies this assay can detect is 250  copies / mL. A negative result does not preclude SARS-CoV-2 infection  and should not be used as the sole basis for treatment or other  patient management decisions.  A negative result may occur with  improper specimen collection / handling, submission of specimen other  than nasopharyngeal swab, presence of viral mutation(s) within the  areas targeted by this assay, and inadequate number of viral copies  (<250 copies / mL). A negative result must be combined with clinical  observations, patient history, and epidemiological information. If result is POSITIVE SARS-CoV-2 target nucleic acids are DETECTED. The SARS-CoV-2 RNA is generally detectable  in upper and lower  respiratory specimens dur ing the acute phase of infection.  Positive  results are indicative of active infection with SARS-CoV-2.  Clinical  correlation with patient history and other diagnostic information is  necessary to determine patient infection status.  Positive results do  not rule out bacterial infection or co-infection with other viruses. If result is PRESUMPTIVE POSTIVE SARS-CoV-2 nucleic acids MAY BE PRESENT.   A presumptive positive result was obtained on the submitted specimen  and confirmed on repeat testing.  While 2019 novel coronavirus  (SARS-CoV-2) nucleic acids may be present in the submitted sample  additional confirmatory testing may be necessary for epidemiological  and / or clinical management purposes  to differentiate between  SARS-CoV-2 and other Sarbecovirus currently known to infect humans.  If clinically indicated additional testing with an alternate test  methodology [phone]) is advised. The SARS-CoV-2 RNA is generally  detectable in upper and lower respiratory sp ecimens during the acute  phase of infection. The expected result is Negative. Fact Sheet for Patients:  StrictlyIdeas.no Fact Sheet for Healthcare Providers: BankingDealers.co.za This test is not yet approved or cleared by the [place] FDA and has been authorized for detection and/or diagnosis of SARS-CoV-2 by FDA under an Emergency Use Authorization (EUA).  This EUA will remain in effect (meaning this test can be used) for the duration of the COVID-19 declaration under Section 564(b)(1) of the Act, 21 U.S.C. section 360bbb-3(b)(1), unless the authorization is terminated or revoked sooner. Performed at [facility], [address] [place], [place] [address]      Studies: US Venous Img Lower Unilateral Left  Result Date: 10/26/2018 CLINICAL DATA:  Left leg swelling EXAM: Left LOWER EXTREMITY VENOUS  DOPPLER ULTRASOUND TECHNIQUE: Gray-scale sonography with graded compression, as well as color Doppler and duplex ultrasound were performed to evaluate the lower extremity deep venous systems from the level of the common femoral vein and including the common femoral, femoral, profunda femoral, popliteal and calf veins including the posterior tibial, peroneal and gastrocnemius veins when visible. The superficial great saphenous vein was also interrogated. Spectral Doppler was utilized to evaluate flow at rest and with distal augmentation maneuvers in the common femoral, femoral and popliteal veins. COMPARISON:  None. FINDINGS: Contralateral Common Femoral Vein: Respiratory phasicity is normal and symmetric with the symptomatic side. No evidence of thrombus. Normal compressibility. Common Femoral Vein: No evidence of thrombus. Normal compressibility, respiratory phasicity and response to augmentation. Saphenofemoral Junction: No evidence of thrombus. Normal compressibility and flow on color Doppler imaging. Profunda Femoral Vein: No evidence of thrombus. Normal compressibility and flow on color Doppler imaging. Femoral Vein: No evidence of thrombus. Normal compressibility, respiratory phasicity and response to augmentation. Popliteal Vein: No evidence of thrombus. Normal compressibility, respiratory phasicity and response to augmentation. Calf Veins:  No evidence of thrombus. Normal compressibility and flow on color Doppler imaging. Superficial Great Saphenous Vein: No evidence of thrombus. Normal compressibility. Venous Reflux:  None. Other Findings:  None. IMPRESSION: No evidence of deep venous thrombosis. Electronically Signed   By: Ulyses Jarred M.D.   On: 10/26/2018 00:24   Dg Foot 2 Views Left  Result Date: 10/25/2018 CLINICAL DATA:  Left foot swelling. EXAM: LEFT FOOT - 2 VIEW COMPARISON:  None. FINDINGS: There is no evidence of fracture or dislocation. Pes cavus (high arches). Scattered osteoarthritis. There  is no other evidence of arthropathy or other focal bone abnormality. There are vascular calcifications. Soft tissue edema overlying the dorsum of the metatarsals IMPRESSION: Dorsal soft tissue edema. No acute osseous abnormality. Electronically Signed   By: Keith Rake M.D.   On: 10/25/2018 22:51    Scheduled Meds: . aspirin EC  81 mg Oral Daily  . divalproex  125 mg Oral BID  . docusate sodium  100 mg Oral BID  . donepezil  10 mg Oral QHS  . enoxaparin (LOVENOX) injection  40 mg Subcutaneous Q24H  . feeding supplement (NEPRO CARB STEADY)  237 mL Oral BID BM  . memantine  10 mg Oral BID  . QUEtiapine  50 mg Oral QHS   Continuous Infusions: . cefTRIAXone (ROCEPHIN)  IV 1 g (10/27/18 0531)    Assessment/Plan:   1. Agitation and weakness.  Restarted his normal Depakote and Seroquel.  Treat urinary tract infection. 2. Acute cystitis.  Continue Rocephin, urine culture report DIPHTHEROIDS. 3. Alzheimer's dementia with agitation, continue Seroquel and Depakote.  Continue Aricept and Namenda 4. Lower extremity edema.  Place TED hose.  Ultrasound the left lower extremity negative for DVT. 5. History of congestive heart failure.  Unknown type, stable. Dehydration.  Oral fluid support. Code Status:     Code Status Orders  (From admission, onward)         Start     Ordered   10/26/18 0917  Do not attempt resuscitation (DNR)  Continuous    Question Answer Comment  In the event of cardiac or respiratory ARREST Do not call a "code blue"   In the event of cardiac or respiratory ARREST Do not perform Intubation, CPR, defibrillation or ACLS   In the event of cardiac or respiratory ARREST Use medication by any route, position, wound care, and other measures to relive pain and suffering. May use oxygen, suction and manual treatment of airway obstruction as needed for comfort.      10/26/18 7672        Code Status History    Date Active Date Inactive Code Status Order ID Comments User  Context   10/26/2018 0424 10/26/2018 0917 Full Code [id number]  Harrie Foreman, MD Inpatient    Advance Directive Documentation     Most Recent Value  Type of Advance Directive  Healthcare Power of Attorney  Pre-existing out of facility DNR order (yellow form or pink MOST form)  -  "MOST" Form in Place?  -     Family Communication: Discussed with his daughter.  According to his daughter, the patient's wife is unable to handle and take care of this patient at home. Disposition Plan: Social worker is trying to find skilled nursing facility placement.  Antibiotics:  Rocephin  Time spent: 33 minutes  [place]

## 2018-10-27 NOTE — Progress Notes (Signed)
PT Cancellation Note  Patient Details Name: Jaime CLINGENPEEL Sr. MRN: [id number] DOB: September 12, 1935   Cancelled Treatment:    Reason Eval/Treat Not Completed: Patient's level of consciousness;Other (comment);Fatigue/lethargy limiting ability to participate(Chart reviewed, RN consulted who reports pt has been climbing out of bed over/between rails most of morning, not following commands, has actually appeared to be lacking gait instability for what little AMB has been obsevered. RN reports pt is now asleep and should be aloud to remain alseep as he arrived with nearly 3D of sleeplessness. Will hold evaluation at this time and attempt again later at date/time.   4:20 PM, 10/27/18 Etta Grandchild, PT, DPT Physical Therapist - [facility]  [phone] ([place])   Dickinson C 10/27/2018, 4:19 PM

## 2018-10-28 DIAGNOSIS — Z7189 Other specified counseling: Secondary | ICD-10-CM | POA: Diagnosis not present

## 2018-10-28 DIAGNOSIS — N3000 Acute cystitis without hematuria: Secondary | ICD-10-CM | POA: Diagnosis present

## 2018-10-28 DIAGNOSIS — N179 Acute kidney failure, unspecified: Secondary | ICD-10-CM | POA: Diagnosis present

## 2018-10-28 DIAGNOSIS — F0281 Dementia in other diseases classified elsewhere with behavioral disturbance: Secondary | ICD-10-CM | POA: Diagnosis present

## 2018-10-28 DIAGNOSIS — E860 Dehydration: Secondary | ICD-10-CM | POA: Diagnosis present

## 2018-10-28 DIAGNOSIS — H9190 Unspecified hearing loss, unspecified ear: Secondary | ICD-10-CM | POA: Diagnosis present

## 2018-10-28 DIAGNOSIS — Z515 Encounter for palliative care: Secondary | ICD-10-CM | POA: Diagnosis present

## 2018-10-28 DIAGNOSIS — N390 Urinary tract infection, site not specified: Secondary | ICD-10-CM | POA: Diagnosis present

## 2018-10-28 DIAGNOSIS — N189 Chronic kidney disease, unspecified: Secondary | ICD-10-CM | POA: Diagnosis present

## 2018-10-28 DIAGNOSIS — J9601 Acute respiratory failure with hypoxia: Secondary | ICD-10-CM | POA: Diagnosis present

## 2018-10-28 DIAGNOSIS — R0602 Shortness of breath: Secondary | ICD-10-CM | POA: Diagnosis not present

## 2018-10-28 DIAGNOSIS — J690 Pneumonitis due to inhalation of food and vomit: Secondary | ICD-10-CM | POA: Diagnosis not present

## 2018-10-28 DIAGNOSIS — M7989 Other specified soft tissue disorders: Secondary | ICD-10-CM | POA: Diagnosis present

## 2018-10-28 DIAGNOSIS — Z1159 Encounter for screening for other viral diseases: Secondary | ICD-10-CM | POA: Diagnosis not present

## 2018-10-28 DIAGNOSIS — I509 Heart failure, unspecified: Secondary | ICD-10-CM | POA: Diagnosis present

## 2018-10-28 DIAGNOSIS — E869 Volume depletion, unspecified: Secondary | ICD-10-CM | POA: Diagnosis present

## 2018-10-28 DIAGNOSIS — E872 Acidosis: Secondary | ICD-10-CM | POA: Diagnosis present

## 2018-10-28 DIAGNOSIS — R410 Disorientation, unspecified: Secondary | ICD-10-CM | POA: Diagnosis not present

## 2018-10-28 DIAGNOSIS — G301 Alzheimer's disease with late onset: Secondary | ICD-10-CM | POA: Diagnosis present

## 2018-10-28 DIAGNOSIS — A419 Sepsis, unspecified organism: Secondary | ICD-10-CM | POA: Diagnosis not present

## 2018-10-28 DIAGNOSIS — G4700 Insomnia, unspecified: Secondary | ICD-10-CM | POA: Diagnosis present

## 2018-10-28 DIAGNOSIS — R531 Weakness: Secondary | ICD-10-CM | POA: Diagnosis not present

## 2018-10-28 DIAGNOSIS — Z66 Do not resuscitate: Secondary | ICD-10-CM | POA: Diagnosis present

## 2018-10-28 LAB — BASIC METABOLIC PANEL
Anion gap: 8 (ref 5–15)
BUN: 20 mg/dL (ref 8–23)
CO2: 29 mmol/L (ref 22–32)
Calcium: 8.9 mg/dL (ref 8.9–10.3)
Chloride: 102 mmol/L (ref 98–111)
Creatinine, Ser: 0.86 mg/dL (ref 0.61–1.24)
GFR calc Af Amer: >60 mL/min (ref >60)
GFR calc non Af Amer: >60 mL/min (ref >60)
Glucose, Bld: 108 mg/dL — ABNORMAL HIGH (ref 70–99)
Potassium: 3.7 mmol/L (ref 3.5–5.1)
Sodium: 139 mmol/L (ref 135–145)

## 2018-10-28 MED ORDER — SODIUM CHLORIDE 0.9 % IV SOLN
INTRAVENOUS | Status: DC | PRN
  Administered 2018-10-28 – 2018-10-29 (×2): 250 mL via INTRAVENOUS

## 2018-10-28 NOTE — Plan of Care (Signed)
  Problem: Education: Goal: Knowledge of General Education information will improve Description Including pain rating scale, medication(s)/side effects and non-pharmacologic comfort measures Outcome: Progressing   Problem: Safety: Goal: Ability to remain free from injury will improve Outcome: Progressing  Floor mats in place Problem: Skin Integrity: Goal: Risk for impaired skin integrity will decrease Outcome: Progressing

## 2018-10-28 NOTE — Progress Notes (Signed)
Patient has become more alert. He is starting to get out of bed and telling the nursing staff "get out of here" , "move ".

## 2018-10-28 NOTE — Progress Notes (Signed)
PT Cancellation Note  Patient Details Name: Jaime NEESON Sr. MRN: [id number] DOB: 30-Jan-1936   Cancelled Treatment:    Reason Eval/Treat Not Completed: Other (comment)(PT spoke with RN and looked in on pt. RN reported increased impulsivity, agitation, and decreased ability to follow commands this AM. Pt able to make eye contact with PT, say a few words, but visibly restless/soft mittens and CNA at bedside.) PT to hold evaluation until POC clearly established and pt more medically appropriate and able to follow commands.    Lieutenant Diego PT, DPT 9:20 AM,10/28/18 [phone]

## 2018-10-28 NOTE — Consult Note (Signed)
Pharmacy Antibiotic Note  Jaime Villegas. is a 83 y.o. male admitted on 10/25/2018 with UTI.    Pharmacy has been consulted for abx for  "UTI >=100,000 COLONIES/mL DIPHTHEROIDS(CORYNEBACTERIUM SPECIES) dosing"  Consulted with Dr Bridgett Larsson that best treatment would probably be PCN, for which unfortunately the patient has a listed allergy.  Discussed the fact that even with higher levels of culture growth, that Diptheroids is frequently a contaminant.     Discussed continuing Ceftriaxone vs challenging PCN allergy listed as "swelling" and Omnicef 300mg  bid x 10-14 total days of therapy as a possible option at discharge if patient continued to improve on Ceftriaxone.  Plan: Will continue Ceftriaxone 1g q 24hours and continue to monitor pt for signs of clinical improvement.  Height: 5\' 8"  (172.7 cm) Weight: 129 lb 3 oz (58.6 kg) IBW/kg (Calculated) : 68.4  Temp (24hrs), Avg:98.9 F (37.2 C), Min:98.4 F (36.9 C), Max:99.3 F (37.4 C)  Recent Labs  Lab 10/25/18 2219 10/28/18 0432  WBC 4.5  --   CREATININE 1.09 0.86    Estimated Creatinine Clearance: 56.8 mL/min (by C-G formula based on SCr of 0.86 mg/dL).    Allergies  Allergen Reactions  . Aspirin   . Bee Pollen     Bee stings  . Darvon [Propoxyphene] Other (See Comments)    hallucinations  . Epinephrine Other (See Comments)    hallucinate  . Penicillins Swelling  . Sulfa Antibiotics Swelling  . Xylocaine [Lidocaine Hcl] Other (See Comments)    hallucinate    Antimicrobials this admission: 05/15 Ceftriaxone >>    Dose adjustments this admission: None  Microbiology results: Results for orders placed or performed during the hospital encounter of 10/25/18  Urine culture     Status: Abnormal   Collection Time: 10/25/18 11:09 PM  Result Value Ref Range Status   Specimen Description   Final    URINE, RANDOM Performed at [facility], [address] [place], [place] [address]    Special Requests   Final     NONE Performed at [facility], [address], [place], [place] [address]    Culture (A)  Final    >=100,000 COLONIES/mL DIPHTHEROIDS(CORYNEBACTERIUM SPECIES) Standardized susceptibility testing for this organism is not available. Performed at [facility], [place] [address] [place], [place] [address]    Report Status 10/27/2018 FINAL  Final  SARS Coronavirus 2 (CEPHEID- Performed in [place] hospital lab), Hosp Order     Status: None   Collection Time: 10/26/18  1:40 AM  Result Value Ref Range Status   SARS Coronavirus 2 NEGATIVE NEGATIVE Final    Comment: (NOTE) If result is NEGATIVE SARS-CoV-2 target nucleic acids are NOT DETECTED. The SARS-CoV-2 RNA is generally detectable in upper and lower  respiratory specimens during the acute phase of infection. The lowest  concentration of SARS-CoV-2 viral copies this assay can detect is 250  copies / mL. A negative result does not preclude SARS-CoV-2 infection  and should not be used as the sole basis for treatment or other  patient management decisions.  A negative result may occur with  improper specimen collection / handling, submission of specimen other  than nasopharyngeal swab, presence of viral mutation(s) within the  areas targeted by this assay, and inadequate number of viral copies  (<250 copies / mL). A negative result must be combined with clinical  observations, patient history, and epidemiological information. If result is POSITIVE SARS-CoV-2 target nucleic acids are DETECTED. The SARS-CoV-2 RNA  is generally detectable in upper and lower  respiratory specimens dur ing the acute phase of infection.  Positive  results are indicative of active infection with SARS-CoV-2.  Clinical  correlation with patient history and other diagnostic information is  necessary to determine patient infection status.  Positive results do  not rule out bacterial infection or co-infection with other viruses. If result is  PRESUMPTIVE POSTIVE SARS-CoV-2 nucleic acids MAY BE PRESENT.   A presumptive positive result was obtained on the submitted specimen  and confirmed on repeat testing.  While 2019 novel coronavirus  (SARS-CoV-2) nucleic acids may be present in the submitted sample  additional confirmatory testing may be necessary for epidemiological  and / or clinical management purposes  to differentiate between  SARS-CoV-2 and other Sarbecovirus currently known to infect humans.  If clinically indicated additional testing with an alternate test  methodology [phone]) is advised. The SARS-CoV-2 RNA is generally  detectable in upper and lower respiratory sp ecimens during the acute  phase of infection. The expected result is Negative. Fact Sheet for Patients:  StrictlyIdeas.no Fact Sheet for Healthcare Providers: BankingDealers.co.za This test is not yet approved or cleared by the [place] FDA and has been authorized for detection and/or diagnosis of SARS-CoV-2 by FDA under an Emergency Use Authorization (EUA).  This EUA will remain in effect (meaning this test can be used) for the duration of the COVID-19 declaration under Section 564(b)(1) of the Act, 21 U.S.C. section 360bbb-3(b)(1), unless the authorization is terminated or revoked sooner. Performed at [facility], [address] [place], [place] [address]      Thank you for allowing pharmacy to be a part of this patient's care.  Lu Duffel, PharmD, BCPS Clinical Pharmacist 10/28/2018 7:21 PM

## 2018-10-28 NOTE — Progress Notes (Signed)
Patient ID: Jaime Liner Sr., male   DOB: [birth year], 83 y.o.   MRN: [id number]  [facility] PROGRESS NOTE  Jaime Villegas VZC:[id number] DOB: April 28, 1936 DOA: 10/25/2018 PCP: Kirk Ruths, MD  HPI/Subjective: Patient is agitated.  On one-to-one sitter.  Objective: Vitals:   10/28/18 0536 10/28/18 0800  BP: (!) 138/93 140/90  Pulse: 99 96  Resp: 20 20  Temp: 99.3 F (37.4 C) 99 F (37.2 C)  SpO2: 100% 99%    Intake/Output Summary (Last 24 hours) at 10/28/2018 1621 Last data filed at 10/28/2018 0606 Gross per 24 hour  Intake 172.43 ml  Output -  Net 172.43 ml   Filed Weights   10/27/18 0040 10/27/18 0110 10/28/18 0500  Weight: 57.6 kg 57.6 kg 58.6 kg    ROS: Review of Systems  Unable to perform ROS: Dementia   Exam: Physical Exam  Eyes: Pupils are equal, round, and reactive to light. Conjunctivae and lids are normal.  Neck: Carotid bruit is not present.  Cardiovascular: Regular rhythm, S1 normal and S2 normal.  Murmur heard.  Systolic murmur is present with a grade of 2/6. Respiratory: He has no decreased breath sounds. He has no wheezes. He has no rhonchi. He has no rales.  GI: Soft. Bowel sounds are normal. He exhibits no distension.  Musculoskeletal:     Right ankle: He exhibits swelling.     Left ankle: He exhibits swelling.  Neurological: He is alert.  Patient moves his arms on his own  Skin: Skin is warm. No rash noted. Nails show no clubbing.  Psychiatric:  Awake but cannot assess.      Data Reviewed: Basic Metabolic Panel: Recent Labs  Lab 10/25/18 2219 10/28/18 0432  NA 140 139  K 3.5 3.7  CL 102 102  CO2 31 29  GLUCOSE 106* 108*  BUN 25* 20  CREATININE 1.09 0.86  CALCIUM 8.3* 8.9   Liver Function Tests: Recent Labs  Lab 10/25/18 2219  AST 17  ALT 8  ALKPHOS 28*  BILITOT 0.3  PROT 6.2*  ALBUMIN 3.0*   CBC: Recent Labs  Lab 10/25/18 2219  WBC 4.5  NEUTROABS 1.9  HGB 11.1*  HCT 33.5*  MCV 96.8  PLT  128*   Cardiac Enzymes: Recent Labs  Lab 10/25/18 2219  TROPONINI <0.03     Recent Results (from the past 240 hour(s))  Urine culture     Status: Abnormal   Collection Time: 10/25/18 11:09 PM  Result Value Ref Range Status   Specimen Description   Final    URINE, RANDOM Performed at [facility], [address] [place], [place] [address]    Special Requests   Final    NONE Performed at [facility], [address] [place], [place] [address]    Culture (A)  Final    >=100,000 COLONIES/mL DIPHTHEROIDS(CORYNEBACTERIUM SPECIES) Standardized susceptibility testing for this organism is not available. Performed at [facility], [address] [place], [place] [address]    Report Status 10/27/2018 FINAL  Final  SARS Coronavirus 2 (CEPHEID- Performed in [place] hospital lab), Hosp Order     Status: None   Collection Time: 10/26/18  1:40 AM  Result Value Ref Range Status   SARS Coronavirus 2 NEGATIVE NEGATIVE Final    Comment: (NOTE) If result is NEGATIVE SARS-CoV-2 target nucleic acids are NOT DETECTED. The SARS-CoV-2 RNA is generally detectable in upper and lower  respiratory specimens during the acute phase of infection.  The lowest  concentration of SARS-CoV-2 viral copies this assay can detect is 250  copies / mL. A negative result does not preclude SARS-CoV-2 infection  and should not be used as the sole basis for treatment or other  patient management decisions.  A negative result may occur with  improper specimen collection / handling, submission of specimen other  than nasopharyngeal swab, presence of viral mutation(s) within the  areas targeted by this assay, and inadequate number of viral copies  (<250 copies / mL). A negative result must be combined with clinical  observations, patient history, and epidemiological information. If result is POSITIVE SARS-CoV-2 target nucleic acids are DETECTED. The SARS-CoV-2 RNA is generally detectable  in upper and lower  respiratory specimens dur ing the acute phase of infection.  Positive  results are indicative of active infection with SARS-CoV-2.  Clinical  correlation with patient history and other diagnostic information is  necessary to determine patient infection status.  Positive results do  not rule out bacterial infection or co-infection with other viruses. If result is PRESUMPTIVE POSTIVE SARS-CoV-2 nucleic acids MAY BE PRESENT.   A presumptive positive result was obtained on the submitted specimen  and confirmed on repeat testing.  While 2019 novel coronavirus  (SARS-CoV-2) nucleic acids may be present in the submitted sample  additional confirmatory testing may be necessary for epidemiological  and / or clinical management purposes  to differentiate between  SARS-CoV-2 and other Sarbecovirus currently known to infect humans.  If clinically indicated additional testing with an alternate test  methodology [phone]) is advised. The SARS-CoV-2 RNA is generally  detectable in upper and lower respiratory sp ecimens during the acute  phase of infection. The expected result is Negative. Fact Sheet for Patients:  StrictlyIdeas.no Fact Sheet for Healthcare Providers: BankingDealers.co.za This test is not yet approved or cleared by the [place] FDA and has been authorized for detection and/or diagnosis of SARS-CoV-2 by FDA under an Emergency Use Authorization (EUA).  This EUA will remain in effect (meaning this test can be used) for the duration of the COVID-19 declaration under Section 564(b)(1) of the Act, 21 U.S.C. section 360bbb-3(b)(1), unless the authorization is terminated or revoked sooner. Performed at [facility], [address] [place], [place] [address]      Studies: No results found.  Scheduled Meds: . aspirin EC  81 mg Oral Daily  . divalproex  125 mg Oral BID  . docusate  100 mg Oral BID  .  donepezil  10 mg Oral QHS  . enoxaparin (LOVENOX) injection  40 mg Subcutaneous Q24H  . feeding supplement (NEPRO CARB STEADY)  237 mL Oral BID BM  . memantine  10 mg Oral BID  . QUEtiapine  50 mg Oral QHS   Continuous Infusions: . sodium chloride Stopped (10/28/18 0544)  . cefTRIAXone (ROCEPHIN)  IV 200 mL/hr at 10/28/18 0606    Assessment/Plan:   1. Agitation and weakness.  Restarted his normal Depakote and Seroquel.  Treat urinary tract infection.  Haldol as needed.  Surveyor, quantity. 2. Acute cystitis.  Continue Rocephin, urine culture report DIPHTHEROIDS. 3. Alzheimer's dementia with agitation, continue Seroquel and Depakote.  Continue Aricept and Namenda 4. Lower extremity edema.  Place TED hose.  Ultrasound the left lower extremity negative for DVT. 5. History of congestive heart failure.  Unknown type, stable. Dehydration.  Oral fluid support.  Improving. Code Status:     Code Status Orders  (From admission, onward)  Start     Ordered   10/26/18 0917  Do not attempt resuscitation (DNR)  Continuous    Question Answer Comment  In the event of cardiac or respiratory ARREST Do not call a "code blue"   In the event of cardiac or respiratory ARREST Do not perform Intubation, CPR, defibrillation or ACLS   In the event of cardiac or respiratory ARREST Use medication by any route, position, wound care, and other measures to relive pain and suffering. May use oxygen, suction and manual treatment of airway obstruction as needed for comfort.      10/26/18 4401        Code Status History    Date Active Date Inactive Code Status Order ID Comments User Context   10/26/2018 0424 10/26/2018 0917 Full Code [id number]  Harrie Foreman, MD Inpatient    Advance Directive Documentation     Most Recent Value  Type of Advance Directive  Healthcare Power of Attorney  Pre-existing out of facility DNR order (yellow form or pink MOST form)  -  "MOST" Form in Place?  -     Family  Communication: Discussed with his daughter.  According to his daughter, the patient's wife is unable to handle and take care of this patient at home. Disposition Plan: Social worker is trying to find skilled nursing facility placement.  Antibiotics:  Rocephin  Time spent: 33 minutes  [place]

## 2018-10-29 DIAGNOSIS — G301 Alzheimer's disease with late onset: Secondary | ICD-10-CM

## 2018-10-29 DIAGNOSIS — F0281 Dementia in other diseases classified elsewhere with behavioral disturbance: Secondary | ICD-10-CM

## 2018-10-29 DIAGNOSIS — N3000 Acute cystitis without hematuria: Principal | ICD-10-CM

## 2018-10-29 DIAGNOSIS — R531 Weakness: Secondary | ICD-10-CM

## 2018-10-29 DIAGNOSIS — R410 Disorientation, unspecified: Secondary | ICD-10-CM

## 2018-10-29 LAB — VALPROIC ACID LEVEL: Valproic Acid Lvl: 33 ug/mL — ABNORMAL LOW (ref 50.0–100.0)

## 2018-10-29 NOTE — Consult Note (Signed)
[facility] Face-to-Face Psychiatry Consult   Reason for Consult: Confusion and agitation. Referring Physician: Dr. Bridgett Larsson Patient Identification: Jaime Liner Sr. MRN:  [id number] Principal Diagnosis: <principal problem not specified> Diagnosis:  Principal Problem:   UTI (urinary tract infection) Active Problems:   Dementia ([place])   Weakness  Patient is seen, chart is reviewed. Total Time spent with patient: 20 minutes  Subjective: Patient mumbles, minimally verbal.  HPI:  Jaime CRAPPS Sr. is a 83 y.o. male patient with past medical history of late onset Alzheimer's dementia, CHF and CKD presents to the emergency department with his family due to agitation.  They report the patient has not slept in 3 days and has become increasingly aggressive and difficult to soothe or make comfortable.  The patient is unable to contribute to his history as he was initially belligerent prior to receiving sedation in the emergency department.  The patient's wife also reports that he has had leg swelling as well.  Initial laboratory evaluation was unremarkable except for urinalysis which was consistent with infection.  The patient was given a dose of ceftriaxone in the emergency department.   On my examination today, patient is laying in bed.  He mumbles incomprehensibly and does not appear to understand even basic questions.  He has a history of being severely hard of hearing.  Unable to assess orientation, or gather any other history.  Per record review Past Psychiatric History: At least according to the electronic medical record his only diagnosis is the advanced Alzheimer's disease.  I would assume that the mirtazapine was for appetite stimulation as well as sleep. His current medications include Depakote DR 125 mg p.o. twice daily, Neurontin 100 mg p.o. every morning and 400 mg p.o. nightly, lorazepam 1 mg p.o. x1, Namenda 10 mg p.o. twice daily and mirtazapine 15 mg p.o. nightly.    Review of the  electronic medical record revealed on 08/06/2018 when he was seen at the [facility] neurology clinic his Jaime Villegas had been discontinued as well as his Aricept and Depakote.  He had been written for gabapentin as in the chart, and his mirtazapine had been started at 7.5 mg p.o. nightly.    Risk to Self:  Minimal Risk to Others:  Minimal Prior Inpatient Therapy:  None Prior Outpatient Therapy:  None  Past Medical History:  Past Medical History:  Diagnosis Date  . Allergy   . Back problem   . Cancer ([place]) 2006, 2013   nose, left ear  . CHF (congestive heart failure) ([place])   . Chronic kidney disease   . Dementia ([place])   . Joint ache     Past Surgical History:  Procedure Laterality Date  . BACK SURGERY    . EXTERNAL EAR SURGERY Left 2013   cancer  . HERNIA REPAIR     left inguinal x 2 right inguinal x 1  . HERNIA REPAIR Right 07/09/14   Bard onlay polypropylene mesh for recurrent hernia repair  . KNEE SURGERY     Family History: History reviewed. No pertinent family history. Family Psychiatric  History: Noncontributory Social History:  Social History   Substance and Sexual Activity  Alcohol Use No  . Alcohol/week: 0.0 standard drinks     Social History   Substance and Sexual Activity  Drug Use No    Social History   Socioeconomic History  . Marital status: Married    Spouse name: Not on file  . Number of children: Not on file  . Years of education:  Not on file  . Highest education level: Not on file  Occupational History  . Not on file  Social Needs  . Financial resource strain: Not on file  . Food insecurity:    Worry: Not on file    Inability: Not on file  . Transportation needs:    Medical: Not on file    Non-medical: Not on file  Tobacco Use  . Smoking status: Former Smoker    Packs/day: 1.00    Years: 40.00    Pack years: 40.00    Types: Cigarettes    Last attempt to quit: 06/13/1973    Years since quitting: 45.4  . Smokeless tobacco: Never Used   Substance and Sexual Activity  . Alcohol use: No    Alcohol/week: 0.0 standard drinks  . Drug use: No  . Sexual activity: Not on file  Lifestyle  . Physical activity:    Days per week: Not on file    Minutes per session: Not on file  . Stress: Not on file  Relationships  . Social connections:    Talks on phone: Not on file    Gets together: Not on file    Attends religious service: Not on file    Active member of club or organization: Not on file    Attends meetings of clubs or organizations: Not on file    Relationship status: Not on file  Other Topics Concern  . Not on file  Social History Narrative  . Not on file   Additional Social History:   Lives with wife, who now has difficulty caring for him.   Allergies:   Allergies  Allergen Reactions  . Aspirin   . Bee Pollen     Bee stings  . Darvon [Propoxyphene] Other (See Comments)    hallucinations  . Epinephrine Other (See Comments)    hallucinate  . Penicillins Swelling  . Sulfa Antibiotics Swelling  . Xylocaine [Lidocaine Hcl] Other (See Comments)    hallucinate    Labs:  Results for orders placed or performed during the hospital encounter of 10/25/18 (from the past 48 hour(s))  Basic metabolic panel     Status: Abnormal   Collection Time: 10/28/18  4:32 AM  Result Value Ref Range   Sodium 139 135 - 145 mmol/L   Potassium 3.7 3.5 - 5.1 mmol/L   Chloride 102 98 - 111 mmol/L   CO2 29 22 - 32 mmol/L   Glucose, Bld 108 (H) 70 - 99 mg/dL   BUN 20 8 - 23 mg/dL   Creatinine, Ser 0.86 0.61 - 1.24 mg/dL   Calcium 8.9 8.9 - 10.3 mg/dL   GFR calc non Af Amer >60 >60 mL/min   GFR calc Af Amer >60 >60 mL/min   Anion gap 8 5 - 15    Comment: Performed at [facility], [address] [place], [place] [address]    Current Facility-Administered Medications  Medication Dose Route Frequency Provider Last Rate Last Dose  . 0.9 %  sodium chloride infusion   Intravenous PRN Demetrios Loll, MD 10 mL/hr at  10/29/18 0508 250 mL at 10/29/18 0508  . acetaminophen (TYLENOL) tablet 650 mg  650 mg Oral Q6H PRN Loletha Grayer, MD       Or  . acetaminophen (TYLENOL) suppository 650 mg  650 mg Rectal Q6H PRN Wieting, Richard, MD      . aspirin EC tablet 81 mg  81 mg Oral Daily Loletha Grayer, MD   81 mg  at 10/29/18 0939  . cefTRIAXone (ROCEPHIN) 1 g in sodium chloride 0.9 % 100 mL IVPB  1 g Intravenous Q24H Loletha Grayer, MD 200 mL/hr at 10/29/18 0508 1 g at 10/29/18 0508  . divalproex (DEPAKOTE SPRINKLE) capsule 125 mg  125 mg Oral BID Demetrios Loll, MD   125 mg at 10/29/18 4174  . docusate (COLACE) 50 MG/5ML liquid 100 mg  100 mg Oral BID Demetrios Loll, MD   100 mg at 10/29/18 0814  . donepezil (ARICEPT) tablet 10 mg  10 mg Oral QHS Loletha Grayer, MD   10 mg at 10/28/18 2046  . enoxaparin (LOVENOX) injection 40 mg  40 mg Subcutaneous Q24H Loletha Grayer, MD   40 mg at 10/28/18 2046  . feeding supplement (NEPRO CARB STEADY) liquid 237 mL  237 mL Oral BID BM Wieting, Richard, MD   237 mL at 10/28/18 1154  . haloperidol lactate (HALDOL) injection 5 mg  5 mg Intravenous Q6H PRN Lance Coon, MD   5 mg at 10/29/18 1135  . memantine (NAMENDA) tablet 10 mg  10 mg Oral BID Loletha Grayer, MD   10 mg at 10/29/18 0939  . ondansetron (ZOFRAN) tablet 4 mg  4 mg Oral Q6H PRN Loletha Grayer, MD       Or  . ondansetron (ZOFRAN) injection 4 mg  4 mg Intravenous Q6H PRN Wieting, Richard, MD      . QUEtiapine (SEROQUEL) tablet 50 mg  50 mg Oral QHS Loletha Grayer, MD   50 mg at 10/28/18 2047  . traZODone (DESYREL) tablet 50 mg  50 mg Oral QHS PRN Loletha Grayer, MD   50 mg at 10/26/18 2044    Musculoskeletal: Strength & Muscle Tone: decreased Gait & Station: Lying in a bed Patient leans: N/A  Psychiatric Specialty Exam: Physical Exam  Constitutional: He appears well-developed. No distress.  HENT:  Head: Normocephalic and atraumatic.  Eyes: EOM are normal.  Cardiovascular: Normal rate.   Respiratory: Effort normal. No respiratory distress.  Neurological: He is alert.    Review of Systems  Unable to perform ROS: Dementia    Blood pressure 109/79, pulse 76, temperature 97.8 F (36.6 C), resp. rate 18, height 5\' 8"  (1.727 m), weight 58.6 kg, SpO2 100 %.Body mass index is 19.64 kg/m.  General Appearance: Disheveled  Eye Contact:  Minimal  Speech:  Garbled  Volume:  Decreased  Mood:  Euthymic  Affect:  Blunt  Thought Process:  NA  Orientation:  Other:  Patient is only oriented to 1.  Thought Content:  NA  Suicidal Thoughts:  No  Homicidal Thoughts:  No  Memory:  Immediate;   Poor Recent;   Poor Remote;   Poor  Judgement:  Impaired  Insight:  Lacking  Psychomotor Activity:  Decreased  Concentration:  Concentration: Poor and Attention Span: Poor  Recall:  Poor  Fund of Knowledge:  Poor  Language:  Fair  Akathisia:  Negative  Handed:  Right  AIMS (if indicated):     Assets:  Resilience  ADL's:  Impaired  Cognition:  Impaired,  Severe  Sleep:      He had a CT scan of the head done on 4/13 that showed no evidence of acute intracranial abnormality.  There was progressive cerebral atrophy as well as chronic small vessel disease.  His EKG was sinus rhythm with multiple PVCs.   Treatment Plan Summary: Daily contact with patient to assess and evaluate symptoms and progress in treatment and Medication management Continue home medications as  per medication reconciliation. Psychiatry will continue to follow, and will evaluate need for as needed medication overnight, with attempt to schedule antipsychotics in order to be effective for patients behavioral needs related to dementia that will best serve his family at home in order to maintain patient and family safety. Depakote level added to existing blood work. Patient also appears to have an underlying delirium.  Encourage aggressive treatment of UTI with antibiotics.  The patient's exam is notable for altered  sensorium, perceptual disturbances, disorientation and cognitive deficits that appear markedly different than their baseline, suggesting a diagnosis of delirium.  Virtually any medical condition or physiologic stress can precipitate delirium in a susceptible individual, with risk increasing in those with: advanced age, sensory impairments, organic brain disease (stroke, dementia, Parkinsons), psychiatric illness, major chronic medical issues, prolonged hospitalizations, postoperative status, anemia, insomnia/disturbed sleep, and severe pain. Addressing the underlying medical condition and institution of preventative measures are recommended.  - Continue to monitor and treat underlying medical causes of delirium, including infection, electrolyte disturbances, etc. - Delirium precautions - Minimize/avoid deliriogenic meds including: anticholinergic, opiates, benzodiazepines           - Maintain hydration, oxygenation, nutrition           - Limit use of restraints and catheters           - Normalize sleep patterns by minimizing nighttime noise, light and interruptions by                clustering care, opening blinds during the day           - Reorient the patient frequently, provide easily visible clock and calendar           - Provide sensory aids like glasses, hearing aids           - Encourage ambulation, regular activities and visitors to maintain cognitive stimulation   Unfortunately patient is suffering from advanced late onset Alzheimer's disease.  He has been on appropriate medications, but his disease has advanced.  The laboratories that have been obtained at this point are within normal limits.  He does not appear to be either suicidal or homicidal.  There is no evidence of psychosis at least at this point.   He most likely needs nursing home placement or placement if family is no longer able to care for him at home.  I will continue to work with them regarding medication management.  If family  desires to take patient home, we will attempt to set up patient with geriatric psychiatrist, versus palliative care.   We will gain further collateral from family.   Disposition: : Patient does not fulfill criteria for inpatient psychiatric admission.  Recommendation of either transfer to a geriatric psychiatric facility or nursing home placement at this time.  Lavella Hammock, MD 10/29/2018 6:08 PM

## 2018-10-29 NOTE — Progress Notes (Signed)
Nutrition Follow Up Note   DOCUMENTATION CODES:   Not applicable  INTERVENTION:   Nepro Shake po BID, each supplement provides 425 kcal and 19 grams protein  Magic cup TID with meals, each supplement provides 290 kcal and 9 grams of protein  NUTRITION DIAGNOSIS:   Inadequate oral intake related to chronic illness(dementia ) as evidenced by other (comment)(per chart review ).  GOAL:   Patient will meet greater than or equal to 90% of their needs  -progressing   MONITOR:   PO intake, Supplement acceptance, Labs, Weight trends, I & O's, Skin  ASSESSMENT:   83 y/o male with past medical history of dementia, CHF and CKD presents to the emergency department with his family due to agitation.  RD working remotely.  Pt with improved appetite and oral intake; pt eating 75% of meals but refusing most of the Nepro. Pt is being offered [employer] on meals trays. Pt seen by SLP and changed to a dysphagia 1/nectar thick diet. Per chart, pt remains fairly weight stable since admit. Pt remains agitated today; sitter at bedside. Recommend continue supplements as tolerated. Palliative care consult pending.   Medications reviewed and include: aspirin, colace, lovenox, ceftriaxone   Labs reviewed:   Diet Order:   Diet Order            Diet - low sodium heart healthy        DIET - DYS 1 Room service appropriate? Yes with Assist; Fluid consistency: Nectar Thick  Diet effective now             EDUCATION NEEDS:   Not appropriate for education at this time  Skin:  Skin Assessment: Reviewed RN Assessment  Last BM:  pta  Height:   Ht Readings from Last 1 Encounters:  10/26/18 5\' 8"  (1.727 m)    Weight:   Wt Readings from Last 1 Encounters:  10/28/18 58.6 kg    Ideal Body Weight:  70 kg  BMI:  Body mass index is 19.64 kg/m.  Estimated Nutritional Needs:   Kcal:  1700-1900kcal/day   Protein:  85-95g/day   Fluid:  >1.4L/day   Koleen Distance MS, RD, LDN Pager #-  [phone] Office#- [phone] After Hours Pager: [phone]

## 2018-10-29 NOTE — Evaluation (Addendum)
Physical Therapy Evaluation Patient Details Name: Jaime BAHRI Sr. MRN: [id number] DOB: [birth year] Today's Date: 10/29/2018   History of Present Illness  Pt is an 83 y.o. male presenting to hospital 10/25/18 with insomnia x3 days, L foot swelling, and agitation.  Pt admitted with weakness, UTI, dementia, and CHF.  Agitation noted during hospitalization.  L LE (-) DVT and acute osseous abnormality but (+) dorsal soft tissue edema.  PMH includes dementia, CHF, CKD, CA, back problem, and knee surgery.  Clinical Impression  Prior to hospital admission, per chart pt was ambulatory.  Currently pt is min assist semi-supine to sit; min assist to stand up to RW; and CGA to min assist to ambulate around nursing loop with RW (pt mostly CGA--overall steady with RW-- but required min assist for walker management around obstacles and turning (2nd CGA present for safety plus chair follow for safety d/t h/o agitation noted in chart).  Pt initially focusing on taking B mitts off upon PT entering room but after therapist took mitts off for functional mobility, pt appearing calmer.  Increased time and cueing required with 1 step commands but overall tolerated session well.  HR up to 112 bpm with activity (with O2 98% or greater on room air).  No c/o pain.  Pt would benefit from skilled PT to address noted impairments and functional limitations (see below for any additional details).  Anticipate pt will do better in familiar home environment with HHPT.  Upon hospital discharge, recommend pt discharge with 24/7 assist with functional mobility for safety.  Addendum:  If pt's family is not able to safely provide 24/7 assist, pt may benefit from trial of STR to improve balance, safety, and decrease assist levels with functional mobility.    Follow Up Recommendations Home health PT;Supervision/Assistance - 24 hour    Equipment Recommendations  Rolling walker with 5" wheels;3in1 (PT)    Recommendations for Other Services        Precautions / Restrictions Precautions Precautions: Fall Precaution Comments: Aspiration Restrictions Weight Bearing Restrictions: No      Mobility  Bed Mobility Overal bed mobility: Needs Assistance Bed Mobility: Supine to Sit     Supine to sit: HOB elevated;Min assist     General bed mobility comments: hand hold assist semi-supine to sit; able to scoot forward on own towards edge of bed with extra time and cueing  Transfers Overall transfer level: Needs assistance Equipment used: Rolling walker (2 wheeled) Transfers: Sit to/from Stand Sit to Stand: Min assist         General transfer comment: assist to initiate stand up to RW; hand over hand tactile cueing for hand placement on RW; tactile cueing for LE placement  Ambulation/Gait Ambulation/Gait assistance: Min guard;Min assist;+2 safety/equipment(plus chair follow) Gait Distance (Feet): 200 Feet Assistive device: Rolling walker (2 wheeled)   Gait velocity: decreased   General Gait Details: decreased B LE step length/foot clearance/heelstrike; more narrow BOS; pt pushing walking more forward; assist for walker navigation with turns and to stay closer to [employer] Mobility    Modified Rankin (Stroke Patients Only)       Balance Overall balance assessment: Needs assistance Sitting-balance support: No upper extremity supported;Feet supported Sitting balance-Leahy Scale: Fair Sitting balance - Comments: steady static sitting   Standing balance support: Single extremity supported Standing balance-Leahy Scale: Poor Standing balance comment: pt requiring at least single UE support for static standing balance  Pertinent Vitals/Pain Pain Assessment: Faces Faces Pain Scale: No hurt Pain Intervention(s): Limited activity within patient's tolerance;Monitored during session;Repositioned    Home Living Family/patient expects to be  discharged to:: Skilled nursing facility                 Additional Comments: Pt unable to verbalize any details of home situation    Prior Function           Comments: Per chart pt ambulatory.     Hand Dominance        Extremity/Trunk Assessment   Upper Extremity Assessment Upper Extremity Assessment: Difficult to assess due to impaired cognition;Generalized weakness    Lower Extremity Assessment Lower Extremity Assessment: Difficult to assess due to impaired cognition;Generalized weakness       Communication   Communication: Expressive difficulties(Minimal verbalizations noted during session)  Cognition Arousal/Alertness: Awake/alert Behavior During Therapy: Flat affect;Impulsive                                   General Comments: Pt unable to state name and DOB when asked (therapist verified via ID bracelet); minimal verbalization during session; inconsistent with one step commands (requires tactile and demo cues)      General Comments   Nursing cleared pt for participation in physical therapy.  Pt appearing agreeable to PT session.  Sitter present during session.    Exercises  Mobility   Assessment/Plan    PT Assessment Patient needs continued PT services  PT Problem List Decreased strength;Decreased balance;Decreased mobility;Decreased knowledge of use of DME;Decreased knowledge of precautions;Decreased safety awareness       PT Treatment Interventions DME instruction;Gait training;Stair training;Functional mobility training;Therapeutic activities;Therapeutic exercise;Balance training;Patient/family education    PT Goals (Current goals can be found in the Care Plan section)  Acute Rehab PT Goals Patient Stated Goal: pt did not state any goals PT Goal Formulation: Patient unable to participate in goal setting Time For Goal Achievement: 11/12/18 Potential to Achieve Goals: Fair    Frequency Min 2X/week   Barriers to discharge         Co-evaluation               AM-PAC PT "6 Clicks" Mobility  Outcome Measure Help needed turning from your back to your side while in a flat bed without using bedrails?: A Little Help needed moving from lying on your back to sitting on the side of a flat bed without using bedrails?: A Little Help needed moving to and from a bed to a chair (including a wheelchair)?: A Little Help needed standing up from a chair using your arms (e.g., wheelchair or bedside chair)?: A Little Help needed to walk in hospital room?: A Little Help needed climbing 3-5 steps with a railing? : A Little 6 Click Score: 18    End of Session Equipment Utilized During Treatment: Gait belt Activity Tolerance: Patient tolerated treatment well Patient left: in chair;with call bell/phone within reach;with nursing/sitter in room;Other (comment)(Sitter present and cleared therapist to keep mitts off (sitter to monitor pt and replace if needed)) Nurse Communication: Mobility status;Precautions PT Visit Diagnosis: Other abnormalities of gait and mobility (R26.89);Muscle weakness (generalized) (M62.81);Difficulty in walking, not elsewhere classified (R26.2)    Time: 1027-2536 PT Time Calculation (min) (ACUTE ONLY): 25 min   Charges:   PT Evaluation $PT Eval Low Complexity: 1 Low PT Treatments $Therapeutic Activity: 8-22 mins       Raquel Sarna  Marlies Ligman, PT 10/29/18, 11:20 AM [phone]

## 2018-10-29 NOTE — NC FL2 (Signed)
[place] LEVEL OF CARE SCREENING TOOL     IDENTIFICATION  Patient Name: Jaime FAULK Sr. Birthdate: December 05, 1935 Sex: male Admission Date (Current Location): 10/25/2018  [place] and [place] Number:  Engineering geologist and Address:  [facility], [address] [place], [place] [address]      Provider Number: [id number]  Attending Physician Name and Address:  Demetrios Loll, MD  Relative Name and Phone Number:  Jolyn Lent [phone]    Current Level of Care: Hospital Recommended Level of Care: [place] Prior Approval Number:    Date Approved/Denied:   PASRR Number:  [id number]  Discharge Plan: SNF    Current Diagnoses: Patient Active Problem List   Diagnosis Date Noted  . UTI (urinary tract infection) 10/28/2018  . Weakness 10/26/2018  . Dementia ([place]) 09/24/2018  . Right inguinal hernia 06/20/2014    Orientation RESPIRATION BLADDER Height & Weight     Self  Normal Incontinent Weight: 58.6 kg Height:  5\' 8"  (172.7 cm)  BEHAVIORAL SYMPTOMS/MOOD NEUROLOGICAL BOWEL NUTRITION STATUS      Incontinent Diet(DYS 1)  AMBULATORY STATUS COMMUNICATION OF NEEDS Skin   Supervision Verbally Normal                       Personal Care Assistance Level of Assistance  Bathing, Feeding, Dressing Bathing Assistance: Limited assistance Feeding assistance: Limited assistance Dressing Assistance: Limited assistance     Functional Limitations Info  Sight, Hearing, Speech Sight Info: Adequate Hearing Info: Adequate Speech Info: Adequate    SPECIAL CARE FACTORS FREQUENCY  PT (By licensed PT)     PT Frequency: 5 x week              Contractures Contractures Info: Not present    Additional Factors Info  Psychotropic(Depakote/Seroquel/Haldol) Code Status Info: DNR Allergies Info: Aspirin, Bee Pollen, Darvon Propoxyphene, Epinephrine, Penicillins, Sulfa Antibiotics, Xylocaine Lidocaine Hcl            Current Medications (10/29/2018):  This is the current hospital active medication list Current Facility-Administered Medications  Medication Dose Route Frequency Provider Last Rate Last Dose  . 0.9 %  sodium chloride infusion   Intravenous PRN Demetrios Loll, MD 10 mL/hr at 10/29/18 0508 250 mL at 10/29/18 0508  . acetaminophen (TYLENOL) tablet 650 mg  650 mg Oral Q6H PRN Loletha Grayer, MD       Or  . acetaminophen (TYLENOL) suppository 650 mg  650 mg Rectal Q6H PRN Loletha Grayer, MD      . aspirin EC tablet 81 mg  81 mg Oral Daily Loletha Grayer, MD   81 mg at 10/29/18 0939  . cefTRIAXone (ROCEPHIN) 1 g in sodium chloride 0.9 % 100 mL IVPB  1 g Intravenous Q24H Loletha Grayer, MD 200 mL/hr at 10/29/18 0508 1 g at 10/29/18 0508  . divalproex (DEPAKOTE SPRINKLE) capsule 125 mg  125 mg Oral BID Demetrios Loll, MD   125 mg at 10/29/18 1030  . docusate (COLACE) 50 MG/5ML liquid 100 mg  100 mg Oral BID Demetrios Loll, MD   100 mg at 10/29/18 1314  . donepezil (ARICEPT) tablet 10 mg  10 mg Oral QHS Loletha Grayer, MD   10 mg at 10/28/18 2046  . enoxaparin (LOVENOX) injection 40 mg  40 mg Subcutaneous Q24H Loletha Grayer, MD   40 mg at 10/28/18 2046  . feeding supplement (NEPRO CARB STEADY) liquid 237 mL  237 mL Oral BID BM Wieting, Richard,  MD   237 mL at 10/28/18 1154  . haloperidol lactate (HALDOL) injection 5 mg  5 mg Intravenous Q6H PRN Lance Coon, MD   5 mg at 10/29/18 1135  . memantine (NAMENDA) tablet 10 mg  10 mg Oral BID Loletha Grayer, MD   10 mg at 10/29/18 0939  . ondansetron (ZOFRAN) tablet 4 mg  4 mg Oral Q6H PRN Loletha Grayer, MD       Or  . ondansetron (ZOFRAN) injection 4 mg  4 mg Intravenous Q6H PRN Wieting, Richard, MD      . QUEtiapine (SEROQUEL) tablet 50 mg  50 mg Oral QHS Loletha Grayer, MD   50 mg at 10/28/18 2047  . traZODone (DESYREL) tablet 50 mg  50 mg Oral QHS PRN Loletha Grayer, MD   50 mg at 10/26/18 2044     Discharge Medications: Please see  discharge summary for a list of discharge medications.  Relevant Imaging Results:  Relevant Lab Results:   Additional Information SSN: [id number]  Elza Rafter, RN

## 2018-10-29 NOTE — Plan of Care (Signed)
  Problem: Clinical Measurements: Goal: Will remain free from infection Outcome: Progressing Goal: Diagnostic test results will improve Outcome: Progressing  Patient is currently on antibiotics Problem: Nutrition: Goal: Adequate nutrition will be maintained Outcome: Progressing   Problem: Safety: Goal: Ability to remain free from injury will improve Outcome: Progressing  Patient has a safety sitter and floor mats

## 2018-10-29 NOTE — NC FL2 (Deleted)
[name] LEVEL OF CARE SCREENING TOOL     IDENTIFICATION  Patient Name: Jaime ANTONELLIS Sr. Birthdate: [birth year] Sex: male Admission Date (Current Location): 10/25/2018  [place] and [place] Number:  Engineering geologist and Address:  [facility], [address] [place], [place] [address]      Provider Number: [id number]  Attending Physician Name and Address:  Demetrios Loll, MD  Relative Name and Phone Number:  Jolyn Lent [phone]    Current Level of Care: Hospital Recommended Level of Care: [place] Prior Approval Number:    Date Approved/Denied:   PASRR Number:    Discharge Plan: SNF    Current Diagnoses: Patient Active Problem List   Diagnosis Date Noted  . UTI (urinary tract infection) 10/28/2018  . Weakness 10/26/2018  . Dementia ([place]) 09/24/2018  . Right inguinal hernia 06/20/2014    Orientation RESPIRATION BLADDER Height & Weight     Self  Normal Incontinent Weight: 58.6 kg Height:  5\' 8"  (172.7 cm)  BEHAVIORAL SYMPTOMS/MOOD NEUROLOGICAL BOWEL NUTRITION STATUS      Incontinent Diet(DYS 1)  AMBULATORY STATUS COMMUNICATION OF NEEDS Skin   Supervision Verbally Normal                       Personal Care Assistance Level of Assistance  Bathing, Feeding, Dressing Bathing Assistance: Limited assistance Feeding assistance: Limited assistance Dressing Assistance: Limited assistance     Functional Limitations Info  Sight, Hearing, Speech Sight Info: Adequate Hearing Info: Adequate Speech Info: Adequate    SPECIAL CARE FACTORS FREQUENCY  PT (By licensed PT)     PT Frequency: 5 x week              Contractures Contractures Info: Not present    Additional Factors Info  Code Status, Allergies Code Status Info: DNR Allergies Info: Aspirin, Bee Pollen, Darvon Propoxyphene, Epinephrine, Penicillins, Sulfa Antibiotics, Xylocaine Lidocaine Hcl           Current Medications  (10/29/2018):  This is the current hospital active medication list Current Facility-Administered Medications  Medication Dose Route Frequency Provider Last Rate Last Dose  . 0.9 %  sodium chloride infusion   Intravenous PRN Demetrios Loll, MD 10 mL/hr at 10/29/18 0508 250 mL at 10/29/18 0508  . acetaminophen (TYLENOL) tablet 650 mg  650 mg Oral Q6H PRN Loletha Grayer, MD       Or  . acetaminophen (TYLENOL) suppository 650 mg  650 mg Rectal Q6H PRN Loletha Grayer, MD      . aspirin EC tablet 81 mg  81 mg Oral Daily Loletha Grayer, MD   81 mg at 10/29/18 0939  . cefTRIAXone (ROCEPHIN) 1 g in sodium chloride 0.9 % 100 mL IVPB  1 g Intravenous Q24H Loletha Grayer, MD 200 mL/hr at 10/29/18 0508 1 g at 10/29/18 0508  . divalproex (DEPAKOTE SPRINKLE) capsule 125 mg  125 mg Oral BID Demetrios Loll, MD   125 mg at 10/29/18 6712  . docusate (COLACE) 50 MG/5ML liquid 100 mg  100 mg Oral BID Demetrios Loll, MD   100 mg at 10/29/18 4580  . donepezil (ARICEPT) tablet 10 mg  10 mg Oral QHS Loletha Grayer, MD   10 mg at 10/28/18 2046  . enoxaparin (LOVENOX) injection 40 mg  40 mg Subcutaneous Q24H Loletha Grayer, MD   40 mg at 10/28/18 2046  . feeding supplement (NEPRO CARB STEADY) liquid 237 mL  237 mL Oral BID BM  Loletha Grayer, MD   237 mL at 10/28/18 1154  . haloperidol lactate (HALDOL) injection 5 mg  5 mg Intravenous Q6H PRN Lance Coon, MD   5 mg at 10/29/18 1135  . memantine (NAMENDA) tablet 10 mg  10 mg Oral BID Loletha Grayer, MD   10 mg at 10/29/18 0939  . ondansetron (ZOFRAN) tablet 4 mg  4 mg Oral Q6H PRN Loletha Grayer, MD       Or  . ondansetron (ZOFRAN) injection 4 mg  4 mg Intravenous Q6H PRN Wieting, Richard, MD      . QUEtiapine (SEROQUEL) tablet 50 mg  50 mg Oral QHS Loletha Grayer, MD   50 mg at 10/28/18 2047  . traZODone (DESYREL) tablet 50 mg  50 mg Oral QHS PRN Loletha Grayer, MD   50 mg at 10/26/18 2044     Discharge Medications: Please see discharge summary for a list of  discharge medications.  Relevant Imaging Results:  Relevant Lab Results:   Additional Information SSN: [id number]  Elza Rafter, RN

## 2018-10-29 NOTE — Progress Notes (Signed)
Patient ID: Jaime Liner Sr., male   DOB: 26-Jan-1936, 83 y.o.   MRN: [id number]  [employer] PROGRESS NOTE  Jaime Villegas [id number] DOB: Jan 28, 1936 DOA: 10/25/2018 PCP: Kirk Ruths, MD  HPI/Subjective: Patient is more agitated..  On one-to-one sitter.  Objective: Vitals:   10/28/18 0800 10/28/18 2017  BP: 140/90 109/79  Pulse: 96 76  Resp: 20 18  Temp: 99 F (37.2 C) 97.8 F (36.6 C)  SpO2: 99% 100%    Intake/Output Summary (Last 24 hours) at 10/29/2018 1314 Last data filed at 10/29/2018 1110 Gross per 24 hour  Intake 247.59 ml  Output 200 ml  Net 47.59 ml   Filed Weights   10/27/18 0040 10/27/18 0110 10/28/18 0500  Weight: 57.6 kg 57.6 kg 58.6 kg    ROS: Review of Systems  Unable to perform ROS: Dementia   Exam: Physical Exam  Eyes: Pupils are equal, round, and reactive to light. Conjunctivae and lids are normal.  Neck: Carotid bruit is not present.  Cardiovascular: Regular rhythm, S1 normal and S2 normal.  Murmur heard.  Systolic murmur is present with a grade of 2/6. Respiratory: He has no decreased breath sounds. He has no wheezes. He has no rhonchi. He has no rales.  GI: Soft. Bowel sounds are normal. He exhibits no distension.  Musculoskeletal:     Right ankle: He exhibits swelling.     Left ankle: He exhibits swelling.  Neurological: He is alert.  Patient moves his arms on his own  Skin: Skin is warm. No rash noted. Nails show no clubbing.  Psychiatric:  Awake but cannot assess.      Data Reviewed: Basic Metabolic Panel: Recent Labs  Lab 10/25/18 2219 10/28/18 0432  NA 140 139  K 3.5 3.7  CL 102 102  CO2 31 29  GLUCOSE 106* 108*  BUN 25* 20  CREATININE 1.09 0.86  CALCIUM 8.3* 8.9   Liver Function Tests: Recent Labs  Lab 10/25/18 2219  AST 17  ALT 8  ALKPHOS 28*  BILITOT 0.3  PROT 6.2*  ALBUMIN 3.0*   CBC: Recent Labs  Lab 10/25/18 2219  WBC 4.5  NEUTROABS 1.9  HGB 11.1*  HCT 33.5*  MCV 96.8   PLT 128*   Cardiac Enzymes: Recent Labs  Lab 10/25/18 2219  TROPONINI <0.03     Recent Results (from the past 240 hour(s))  Urine culture     Status: Abnormal   Collection Time: 10/25/18 11:09 PM  Result Value Ref Range Status   Specimen Description   Final    URINE, RANDOM Performed at [facility], [address] [place], [place] [address]    Special Requests   Final    NONE Performed at [facility], [address], [place], [place] [address]    Culture (A)  Final    >=100,000 COLONIES/mL DIPHTHEROIDS(CORYNEBACTERIUM SPECIES) Standardized susceptibility testing for this organism is not available. Performed at [facility], [place] [address], [place], [place] [address]    Report Status 10/27/2018 FINAL  Final  SARS Coronavirus 2 (CEPHEID- Performed in [place] hospital lab), Hosp Order     Status: None   Collection Time: 10/26/18  1:40 AM  Result Value Ref Range Status   SARS Coronavirus 2 NEGATIVE NEGATIVE Final    Comment: (NOTE) If result is NEGATIVE SARS-CoV-2 target nucleic acids are NOT DETECTED. The SARS-CoV-2 RNA is generally detectable in upper and lower  respiratory specimens during the acute phase of  infection. The lowest  concentration of SARS-CoV-2 viral copies this assay can detect is 250  copies / mL. A negative result does not preclude SARS-CoV-2 infection  and should not be used as the sole basis for treatment or other  patient management decisions.  A negative result may occur with  improper specimen collection / handling, submission of specimen other  than nasopharyngeal swab, presence of viral mutation(s) within the  areas targeted by this assay, and inadequate number of viral copies  (<250 copies / mL). A negative result must be combined with clinical  observations, patient history, and epidemiological information. If result is POSITIVE SARS-CoV-2 target nucleic acids are DETECTED. The SARS-CoV-2 RNA is generally  detectable in upper and lower  respiratory specimens dur ing the acute phase of infection.  Positive  results are indicative of active infection with SARS-CoV-2.  Clinical  correlation with patient history and other diagnostic information is  necessary to determine patient infection status.  Positive results do  not rule out bacterial infection or co-infection with other viruses. If result is PRESUMPTIVE POSTIVE SARS-CoV-2 nucleic acids MAY BE PRESENT.   A presumptive positive result was obtained on the submitted specimen  and confirmed on repeat testing.  While 2019 novel coronavirus  (SARS-CoV-2) nucleic acids may be present in the submitted sample  additional confirmatory testing may be necessary for epidemiological  and / or clinical management purposes  to differentiate between  SARS-CoV-2 and other Sarbecovirus currently known to infect humans.  If clinically indicated additional testing with an alternate test  methodology [phone]) is advised. The SARS-CoV-2 RNA is generally  detectable in upper and lower respiratory sp ecimens during the acute  phase of infection. The expected result is Negative. Fact Sheet for Patients:  StrictlyIdeas.no Fact Sheet for Healthcare Providers: BankingDealers.co.za This test is not yet approved or cleared by the [place] FDA and has been authorized for detection and/or diagnosis of SARS-CoV-2 by FDA under an Emergency Use Authorization (EUA).  This EUA will remain in effect (meaning this test can be used) for the duration of the COVID-19 declaration under Section 564(b)(1) of the Act, 21 U.S.C. section 360bbb-3(b)(1), unless the authorization is terminated or revoked sooner. Performed at [facility], [address] [place], [place] [address]      Studies: No results found.  Scheduled Meds: . aspirin EC  81 mg Oral Daily  . divalproex  125 mg Oral BID  . docusate  100 mg Oral  BID  . donepezil  10 mg Oral QHS  . enoxaparin (LOVENOX) injection  40 mg Subcutaneous Q24H  . feeding supplement (NEPRO CARB STEADY)  237 mL Oral BID BM  . memantine  10 mg Oral BID  . QUEtiapine  50 mg Oral QHS   Continuous Infusions: . sodium chloride 250 mL (10/29/18 0508)  . cefTRIAXone (ROCEPHIN)  IV 1 g (10/29/18 0508)    Assessment/Plan:   1. Agitation and weakness.  Restarted his normal Depakote and Seroquel.  Treat urinary tract infection.  Haldol as needed.  Surveyor, quantity. 2. Acute cystitis.  Continue Rocephin, urine culture report DIPHTHEROIDS.  Per Dr. Timmie Foerster, possible due to contamination. 3. Alzheimer's dementia with agitation, continue Seroquel and Depakote.  Continue Aricept and Namenda.  Behavioral medicine consult. 4. Lower extremity edema.  Place TED hose.  Ultrasound the left lower extremity negative for DVT. 5. History of congestive heart failure.  Unknown type, stable. Dehydration.  Oral fluid support.  Improving. Code Status:  Code Status Orders  (From admission, onward)         Start     Ordered   10/26/18 0917  Do not attempt resuscitation (DNR)  Continuous    Question Answer Comment  In the event of cardiac or respiratory ARREST Do not call a "code blue"   In the event of cardiac or respiratory ARREST Do not perform Intubation, CPR, defibrillation or ACLS   In the event of cardiac or respiratory ARREST Use medication by any route, position, wound care, and other measures to relive pain and suffering. May use oxygen, suction and manual treatment of airway obstruction as needed for comfort.      10/26/18 9470        Code Status History    Date Active Date Inactive Code Status Order ID Comments User Context   10/26/2018 0424 10/26/2018 0917 Full Code [id number]  Harrie Foreman, MD Inpatient    Advance Directive Documentation     Most Recent Value  Type of Advance Directive  Healthcare Power of Attorney  Pre-existing out of facility  DNR order (yellow form or pink MOST form)  -  "MOST" Form in Place?  -     Family Communication: Discussed with his daughter.  According to his daughter, the patient's wife is unable to handle and take care of this patient at home. Disposition Plan: Social worker is trying to find skilled nursing facility placement.  Antibiotics:  Rocephin  Time spent: 27 minutes  [place]

## 2018-10-29 NOTE — TOC Progression Note (Addendum)
Transition of Care (TOC) - Progression Note    Patient Details  Name: Jaime Villegas. MRN: [id number] Date of Birth: 1935-07-05  Transition of Care [facility]) CM/SW Contact  Elza Rafter, RN Phone Number: 10/29/2018, 9:47 AM  Clinical Narrative:   Patient was unable to work with PT yesterday due to confusion. Spoke with daughter Judeen Hammans [phone]; she states they have started a medicaid application with DSS.  She states her mother cannot care for him at home.  Started FL2 and sent out for bed offers.       Expected Discharge Plan: Long Term Nursing Home Barriers to Discharge: Continued Medical Work up  Expected Discharge Plan and Services Expected Discharge Plan: [place]       Living arrangements for the past 2 months: Single Family Home Expected Discharge Date: 10/27/18                                     Social Determinants of Health (SDOH) Interventions    Readmission Risk Interventions Readmission Risk Prevention Plan 10/26/2018  Transportation Screening Complete  Some recent data might be hidden

## 2018-10-30 ENCOUNTER — Inpatient Hospital Stay: Payer: Medicare Other

## 2018-10-30 DIAGNOSIS — F0281 Dementia in other diseases classified elsewhere with behavioral disturbance: Secondary | ICD-10-CM

## 2018-10-30 DIAGNOSIS — Z515 Encounter for palliative care: Secondary | ICD-10-CM

## 2018-10-30 DIAGNOSIS — Z7189 Other specified counseling: Secondary | ICD-10-CM

## 2018-10-30 DIAGNOSIS — G301 Alzheimer's disease with late onset: Secondary | ICD-10-CM

## 2018-10-30 DIAGNOSIS — R531 Weakness: Secondary | ICD-10-CM

## 2018-10-30 LAB — LACTIC ACID, PLASMA: Lactic Acid, Venous: 2.6 mmol/L (ref 0.5–1.9)

## 2018-10-30 LAB — VALPROIC ACID LEVEL: Valproic Acid Lvl: 36 ug/mL — ABNORMAL LOW (ref 50.0–100.0)

## 2018-10-30 MED ORDER — METOPROLOL TARTRATE 25 MG PO TABS: 12.5000 mg | ORAL_TABLET | Freq: Two times a day (BID) | ORAL | Status: AC

## 2018-10-30 MED ORDER — SODIUM CHLORIDE 0.9 % IV SOLN
1.0000 g | Freq: Three times a day (TID) | INTRAVENOUS | Status: DC
  Filled 2018-10-30 (×3): qty 1

## 2018-10-30 MED ORDER — KETOROLAC TROMETHAMINE 15 MG/ML IJ SOLN
15.0000 mg | Freq: Once | INTRAMUSCULAR | Status: AC
  Administered 2018-10-30: 15 mg via INTRAVENOUS
  Filled 2018-10-30: qty 1

## 2018-10-30 MED ORDER — VANCOMYCIN HCL 10 G IV SOLR
1500.0000 mg | Freq: Once | INTRAVENOUS | Status: AC
  Administered 2018-10-31: 01:00:00 1500 mg via INTRAVENOUS
  Filled 2018-10-30: qty 1500

## 2018-10-30 MED ORDER — METOPROLOL TARTRATE 25 MG PO TABS
12.5000 mg | ORAL_TABLET | Freq: Two times a day (BID) | ORAL | Status: DC
  Administered 2018-10-30: 12.5 mg via ORAL
  Filled 2018-10-30: qty 1

## 2018-10-30 MED ORDER — HYDRALAZINE HCL 20 MG/ML IJ SOLN: 10.0000 mg | Freq: Four times a day (QID) | INTRAMUSCULAR | Status: DC | PRN

## 2018-10-30 MED ORDER — QUETIAPINE FUMARATE 100 MG PO TABS
100.0000 mg | ORAL_TABLET | Freq: Every day | ORAL | Status: DC
  Filled 2018-10-30 (×3): qty 1

## 2018-10-30 MED ORDER — SODIUM CHLORIDE 0.9 % IV SOLN
INTRAVENOUS | Status: DC
  Administered 2018-10-31: 03:00:00 via INTRAVENOUS

## 2018-10-30 MED ORDER — SODIUM CHLORIDE 0.9 % IV SOLN
INTRAVENOUS | Status: DC
  Administered 2018-10-31: 02:00:00 via INTRAVENOUS

## 2018-10-30 MED ORDER — DIVALPROEX SODIUM ER 250 MG PO TB24
250.0000 mg | ORAL_TABLET | Freq: Every day | ORAL | Status: DC
  Filled 2018-10-30 (×2): qty 1

## 2018-10-30 MED ORDER — QUETIAPINE FUMARATE 25 MG PO TABS: 25.0000 mg | ORAL_TABLET | Freq: Two times a day (BID) | ORAL | Status: DC | PRN

## 2018-10-30 NOTE — Progress Notes (Signed)
Patient is constantly clearing his throat. Dr called Chest xray ordered, will continue to monitor.

## 2018-10-30 NOTE — Progress Notes (Signed)
Patient being treated here for UTI.  Plan per nursing report was to move towards discharge relatively soon, however the patient developed significant fevers over the last 24 hours.  On evaluation tonight he is found to have pneumonia, with an elevated lactic acid.  He meets sepsis criteria.  Antibiotics were broadened from Rocephin to Vanco and meropenem, and IV fluids was started to treat his lactic acid.  Blood cultures were sent.  [facility] 10/30/2018, 11:24 PM  Note:  This document was prepared using Dragon voice recognition software and may include unintentional dictation errors.

## 2018-10-30 NOTE — Consult Note (Signed)
Consultation Note Date: 10/30/2018   Patient Name: Jaime Villegas Sr.  DOB: 05/06/1936  MRN: [id number]  Age / Sex: 83 y.o., male  PCP: Kirk Ruths, MD Referring Physician: Demetrios Loll, MD  Reason for Consultation: Establishing goals of care  HPI/Patient Profile: 83 y.o. male  with past medical history of Alzheimer's dementia, CHF, CKD admitted on 10/25/2018 with agitation from home. Family reports patient has not slept in 3 days and became increasingly aggressive and difficult to manage. In ED, urinalysis consistent with UTI and given ceftriazone x1. Required safety sitter this admission due to agitation. Psych evaluation with plan to continue home dose medications. Patient receiving Seroquel, Depakote, Aricept, and Namenda. Requires higher level of care and family requesting SNF placement. Palliative medicine consultation for goals of care.   Clinical Assessment and Goals of Care:  I have reviewed medical records, discussed with care team and assessed the patient. He is awake but disoriented with baseline dementia. Unable to participate in [place] discussion. No s/s of discomfort or distress.  No family at bedside due to Covid-19 visitor restrictions. Spoke with daughter, Jaime Villegas via telephone.  Introduced Palliative Medicine as specialized medical care for people living with serious illness. It focuses on providing relief from the symptoms and stress of a serious illness. The goal is to improve quality of life for both the patient and the family.  We discussed a brief life review of the patient. Married to wife for 19 years. 2 children. Diagnosed with Alzheimer's in 2017. Jaime Villegas shares that her mother is unable to care for her father anymore due to worsening confusion/agitation and the need for assistance and encouragement with ADL's. "She's afraid to fall asleep." She recalls this past winter, when her  father went outside, took all of this clothes off and stood in the cold.   Discussed events leading up to admission and course of hospitalization including diagnoses and interventions. Discussed disease trajectory of Alzheimer's dementia and expectations moving forward.  I attempted to elicit values and goals of care important to the patient and family. Advanced directives, concepts specific to code status, artifical feeding and hydration were discussed. Jaime Villegas confirms her mother's decision for DNR code status, understanding poor chances of survival and recommendation against heroic measures at EOL with underlying irreversible condition. Her mother has frequently stated "let him go."   Jaime Villegas is most concerned about his approval for Medicaid (application pending) and the ability to get him into higher level of care/SNF facility. She shares that "money is a big issue" and the hope that his Medicaid will be approved soon since her mother does not have financial means to pay out of pocket for nursing facility.   Hospice and Palliative Care services outpatient were explained and offered. Daughter agreeable for outpatient palliative referral at SNF.   Questions and concerns were addressed. PMT contact information given.     SUMMARY OF RECOMMENDATIONS    DNR in event of cardiac arrest.   Continue current plan of care and conservative management.   Appreciate  psych eval and recommendations.   Family most concerned about Medicaid approval and getting him into nursing facility. RN CM following.   Daughter agreeable for outpatient palliative referral. RN CM has notified palliative liaison.   Could benefit from MOST form completion.  Needs to be safety sitter free x24 hours before discharge.    Code Status/Advance Care Planning:  DNR  Symptom Management:   Per attending  Palliative Prophylaxis:   Aspiration, Delirium Protocol, Oral Care and Turn Reposition  Psycho-social/Spiritual:    Desire for further Chaplaincy support:yes  Additional Recommendations: Caregiving  Support/Resources, Compassionate Wean Education and Education on Hospice  Prognosis:   Unable to determine  Discharge Planning: [name] for rehab with Palliative care service follow-up      Primary Diagnoses: Present on Admission: . UTI (urinary tract infection) . Dementia ([name])   I have reviewed the medical record, interviewed the patient and family, and examined the patient. The following aspects are pertinent.  Past Medical History:  Diagnosis Date  . Allergy   . Back problem   . Cancer ([place]) 2006, 2013   nose, left ear  . CHF (congestive heart failure) ([place])   . Chronic kidney disease   . Dementia ([place])   . Joint ache    Social History   Socioeconomic History  . Marital status: Married    Spouse name: Not on file  . Number of children: Not on file  . Years of education: Not on file  . Highest education level: Not on file  Occupational History  . Not on file  Social Needs  . Financial resource strain: Not on file  . Food insecurity:    Worry: Not on file    Inability: Not on file  . Transportation needs:    Medical: Not on file    Non-medical: Not on file  Tobacco Use  . Smoking status: Former Smoker    Packs/day: 1.00    Years: 40.00    Pack years: 40.00    Types: Cigarettes    Last attempt to quit: 06/13/1973    Years since quitting: 45.4  . Smokeless tobacco: Never Used  Substance and Sexual Activity  . Alcohol use: No    Alcohol/week: 0.0 standard drinks  . Drug use: No  . Sexual activity: Not on file  Lifestyle  . Physical activity:    Days per week: Not on file    Minutes per session: Not on file  . Stress: Not on file  Relationships  . Social connections:    Talks on phone: Not on file    Gets together: Not on file    Attends religious service: Not on file    Active member of club or organization: Not on file    Attends meetings of  clubs or organizations: Not on file    Relationship status: Not on file  Other Topics Concern  . Not on file  Social History Narrative  . Not on file   History reviewed. No pertinent family history. Scheduled Meds: . aspirin EC  81 mg Oral Daily  . divalproex  250 mg Oral QHS  . docusate  100 mg Oral BID  . enoxaparin (LOVENOX) injection  40 mg Subcutaneous Q24H  . feeding supplement (NEPRO CARB STEADY)  237 mL Oral BID BM  . metoprolol tartrate  12.5 mg Oral BID  . QUEtiapine  100 mg Oral QHS   Continuous Infusions: . sodium chloride 250 mL (10/29/18 0508)  . cefTRIAXone (ROCEPHIN)  IV 1 g (10/30/18 0503)   PRN Meds:.sodium chloride, acetaminophen **OR** acetaminophen, haloperidol lactate, hydrALAZINE, ondansetron **OR** ondansetron (ZOFRAN) IV, QUEtiapine Medications Prior to Admission:  Prior to Admission medications   Medication Sig Start Date End Date Taking? Authorizing Provider  aspirin EC 81 MG tablet Take by mouth daily.    Yes [provider]  cetirizine (ZYRTEC) 10 MG tablet Take 10 mg by mouth daily.   Yes [provider]  cholecalciferol (VITAMIN D3) 25 MCG (1000 UT) tablet Take 1,000 Units by mouth daily.   Yes [provider]  divalproex (DEPAKOTE) 125 MG DR tablet Take 125 mg by mouth 2 (two) times daily. 09/18/18  Yes [provider]  donepezil (ARICEPT) 10 MG tablet Take 10 mg by mouth at bedtime.   Yes [provider]  gabapentin (NEURONTIN) 100 MG capsule Take 100 mg by mouth every morning. 08/06/18  Yes [provider]  gabapentin (NEURONTIN) 400 MG capsule Take 400 mg by mouth Nightly. 08/06/18  Yes [provider]  ibuprofen (ADVIL,MOTRIN) 200 MG tablet Take 200 mg by mouth every 6 (six) hours as needed.   Yes [provider]  memantine (NAMENDA) 10 MG tablet Take 10 mg by mouth 2 (two) times daily. 07/31/18  Yes [provider]  QUEtiapine (SEROQUEL) 25 MG tablet Take 50 mg by mouth  at bedtime. 10/16/18 11/15/18 Yes [provider]  traMADol (ULTRAM) 50 MG tablet Take 50 mg by mouth at bedtime.  08/06/18  Yes [provider]  traZODone (DESYREL) 50 MG tablet Take 50 mg by mouth at bedtime. 10/24/18  Yes [provider]  metoprolol tartrate (LOPRESSOR) 25 MG tablet Take 0.5 tablets (12.5 mg total) by mouth 2 (two) times daily. 10/30/18   Demetrios Loll, MD   Allergies  Allergen Reactions  . Aspirin   . Bee Pollen     Bee stings  . Darvon [Propoxyphene] Other (See Comments)    hallucinations  . Epinephrine Other (See Comments)    hallucinate  . Penicillins Swelling  . Sulfa Antibiotics Swelling  . Xylocaine [Lidocaine Hcl] Other (See Comments)    hallucinate   Review of Systems  Unable to perform ROS: Dementia    Physical Exam Vitals signs and nursing note reviewed.  Constitutional:      Appearance: He is ill-appearing.  HENT:     Head: Normocephalic and atraumatic.  Pulmonary:     Effort: No tachypnea, accessory muscle usage or respiratory distress.  Abdominal:     Tenderness: There is no abdominal tenderness.  Neurological:     Mental Status: He is easily aroused.     Comments: Alert, disoriented with baseline dementia  Psychiatric:        Attention and Perception: He is inattentive.        Cognition and Memory: Cognition is impaired.    Vital Signs: BP 127/71 (BP Location: Right Arm)   Pulse 81   Temp 98.2 F (36.8 C) (Oral)   Resp 18   Ht 5\' 8"  (1.727 m)   Wt 57.9 kg   SpO2 94%   BMI 19.41 kg/m  Pain Scale: PAINAD POSS *See Group Information*: S-Acceptable,Sleep, easy to arouse Pain Score: 0-No pain   SpO2: SpO2: 94 % O2 Device:SpO2: 94 % O2 Flow Rate: .   IO: Intake/output summary:   Intake/Output Summary (Last 24 hours) at 10/30/2018 1504 Last data filed at 10/30/2018 0512 Gross per 24 hour  Intake 175 ml  Output 600 ml  Net -425  ml    LBM: Last BM Date: 10/27/18 Baseline Weight: Weight: 77.1 kg Most  recent weight: Weight: 57.9 kg     Palliative Assessment/Data: PPS 50%   Flowsheet Rows     Most Recent Value  Intake Tab  Referral Department  Hospitalist  Unit at Time of Referral  Med/Surg Unit  Palliative Care Primary Diagnosis  Neurology  Date Notified  10/29/18  Palliative Care Type  New Palliative care  Reason for referral  Clarify Goals of Care  Date of Admission  10/25/18  Date first seen by Palliative Care  10/30/18  # of days IP prior to Palliative referral  4  Clinical Assessment  Palliative Performance Scale Score  50%  Psychosocial & Spiritual Assessment  Palliative Care Outcomes  Patient/Family meeting held?  Yes  Who was at the meeting?  daughter  Palliative Care Outcomes  Clarified goals of care, Provided end of life care assistance, Provided psychosocial or spiritual support, ACP counseling assistance, Linked to palliative care logitudinal support      Time In/Out: 1120-1140, 1320-1400  Time Total: 60 Greater than 50%  of this time was spent counseling and coordinating care related to the above assessment and plan.  Signed by:  Ihor Dow, FNP-C Palliative Medicine Team  Phone: [phone] Fax: [phone]   Please contact Palliative Medicine Team phone at [phone] for questions and concerns.  For individual provider: See Shea Evans

## 2018-10-30 NOTE — Consult Note (Signed)
[facility] Face-to-Face Psychiatry Consult   Reason for Consult: Confusion and agitation. Referring Physician: Dr. Bridgett Larsson Patient Identification: Jaime Liner Sr. MRN:  [id number] Principal Diagnosis: UTI (urinary tract infection) Diagnosis:  Principal Problem:   UTI (urinary tract infection) Active Problems:   Dementia ([place])   Weakness   Palliative care by specialist   Goals of care, counseling/discussion  Patient is seen, chart is reviewed. Total Time spent with patient: 20 minutes  Subjective: Patient mumbles, minimally verbal.  HPI:  Jaime KOHLMANN Sr. is a 83 y.o. male patient with past medical history of late onset Alzheimer's dementia, CHF and CKD presents to the emergency department with his family due to agitation.  They report the patient has not slept in 3 days and has become increasingly aggressive and difficult to soothe or make comfortable.  The patient is unable to contribute to his history as he was initially belligerent prior to receiving sedation in the emergency department.  The patient's wife also reports that he has had leg swelling as well.  Initial laboratory evaluation was unremarkable except for urinalysis which was consistent with infection.  The patient was given a dose of ceftriaxone in the emergency department.   On my examination today, patient is laying in bed.  He is alert, and mumbles incomprehensibly and does not appear to understand even basic questions.  He has a history of being severely hard of hearing.  Unable to assess orientation.  Nursing is queried regarding patient's status.  He no longer has a sitter at bedside and nursing staff reports that medication changes seem to have improved patient's behavior.  Family is stating they are no longer able to care for patient at home, and patient has manageable behavior without sitter at bedside for greater than 24 hours he can be reassessed for placement into a skilled nursing facility.  Per record review Past  Psychiatric History: At least according to the electronic medical record his only diagnosis is the advanced Alzheimer's disease.  I would assume that the mirtazapine was for appetite stimulation as well as sleep. His current medications include Depakote DR 125 mg p.o. twice daily, Neurontin 100 mg p.o. every morning and 400 mg p.o. nightly, lorazepam 1 mg p.o. x1, Namenda 10 mg p.o. twice daily and mirtazapine 15 mg p.o. nightly.    Review of the electronic medical record revealed on 08/06/2018 when he was seen at the [facility] neurology clinic his [place] had been discontinued as well as his Aricept and Depakote.  He had been written for gabapentin as in the chart, and his mirtazapine had been started at 7.5 mg p.o. nightly.    Risk to Self:  Minimal Risk to Others:  Minimal Prior Inpatient Therapy:  None Prior Outpatient Therapy:  None  Past Medical History:  Past Medical History:  Diagnosis Date  . Allergy   . Back problem   . Cancer ([place]) 2006, 2013   nose, left ear  . CHF (congestive heart failure) ([place])   . Chronic kidney disease   . Dementia ([place])   . Joint ache     Past Surgical History:  Procedure Laterality Date  . BACK SURGERY    . EXTERNAL EAR SURGERY Left 2013   cancer  . HERNIA REPAIR     left inguinal x 2 right inguinal x 1  . HERNIA REPAIR Right 07/09/14   Bard onlay polypropylene mesh for recurrent hernia repair  . KNEE SURGERY     Family History: History reviewed. No pertinent  family history. Family Psychiatric  History: Noncontributory Social History:  Social History   Substance and Sexual Activity  Alcohol Use No  . Alcohol/week: 0.0 standard drinks     Social History   Substance and Sexual Activity  Drug Use No    Social History   Socioeconomic History  . Marital status: Married    Spouse name: Not on file  . Number of children: Not on file  . Years of education: Not on file  . Highest education level: Not on file  Occupational History  . Not  on file  Social Needs  . Financial resource strain: Not on file  . Food insecurity:    Worry: Not on file    Inability: Not on file  . Transportation needs:    Medical: Not on file    Non-medical: Not on file  Tobacco Use  . Smoking status: Former Smoker    Packs/day: 1.00    Years: 40.00    Pack years: 40.00    Types: Cigarettes    Last attempt to quit: 06/13/1973    Years since quitting: 45.4  . Smokeless tobacco: Never Used  Substance and Sexual Activity  . Alcohol use: No    Alcohol/week: 0.0 standard drinks  . Drug use: No  . Sexual activity: Not on file  Lifestyle  . Physical activity:    Days per week: Not on file    Minutes per session: Not on file  . Stress: Not on file  Relationships  . Social connections:    Talks on phone: Not on file    Gets together: Not on file    Attends religious service: Not on file    Active member of club or organization: Not on file    Attends meetings of clubs or organizations: Not on file    Relationship status: Not on file  Other Topics Concern  . Not on file  Social History Narrative  . Not on file   Additional Social History:   Lives with wife, who now has difficulty caring for him. Family is requesting assistance for patient to be placed in skilled nursing facility at this time.   Allergies:   Allergies  Allergen Reactions  . Aspirin   . Bee Pollen     Bee stings  . Darvon [Propoxyphene] Other (See Comments)    hallucinations  . Epinephrine Other (See Comments)    hallucinate  . Penicillins Swelling  . Sulfa Antibiotics Swelling  . Xylocaine [Lidocaine Hcl] Other (See Comments)    hallucinate    Labs:  Results for orders placed or performed during the hospital encounter of 10/25/18 (from the past 48 hour(s))  Valproic acid level     Status: Abnormal   Collection Time: 10/30/18  2:20 PM  Result Value Ref Range   Valproic Acid Lvl 36 (L) 50.0 - 100.0 ug/mL    Comment: Performed at [facility], [address] [place], [place] [address]    Current Facility-Administered Medications  Medication Dose Route Frequency Provider Last Rate Last Dose  . 0.9 %  sodium chloride infusion   Intravenous PRN Demetrios Loll, MD 10 mL/hr at 10/29/18 0508 250 mL at 10/29/18 0508  . acetaminophen (TYLENOL) tablet 650 mg  650 mg Oral Q6H PRN Loletha Grayer, MD       Or  . acetaminophen (TYLENOL) suppository 650 mg  650 mg Rectal Q6H PRN Loletha Grayer, MD      . aspirin EC tablet 81 mg  81 mg Oral Daily Loletha Grayer, MD   81 mg at 10/30/18 0844  . cefTRIAXone (ROCEPHIN) 1 g in sodium chloride 0.9 % 100 mL IVPB  1 g Intravenous Q24H Demetrios Loll, MD 200 mL/hr at 10/30/18 0503 1 g at 10/30/18 0503  . divalproex (DEPAKOTE ER) 24 hr tablet 250 mg  250 mg Oral QHS Lavella Hammock, MD      . docusate (COLACE) 50 MG/5ML liquid 100 mg  100 mg Oral BID Demetrios Loll, MD   100 mg at 10/30/18 0844  . enoxaparin (LOVENOX) injection 40 mg  40 mg Subcutaneous Q24H Loletha Grayer, MD   40 mg at 10/29/18 2051  . feeding supplement (NEPRO CARB STEADY) liquid 237 mL  237 mL Oral BID BM Wieting, Richard, MD   237 mL at 10/28/18 1154  . haloperidol lactate (HALDOL) injection 5 mg  5 mg Intravenous Q6H PRN Lance Coon, MD   5 mg at 10/29/18 2245  . hydrALAZINE (APRESOLINE) injection 10 mg  10 mg Intravenous Q6H PRN Demetrios Loll, MD      . metoprolol tartrate (LOPRESSOR) tablet 12.5 mg  12.5 mg Oral BID Demetrios Loll, MD   12.5 mg at 10/30/18 0933  . ondansetron (ZOFRAN) tablet 4 mg  4 mg Oral Q6H PRN Loletha Grayer, MD       Or  . ondansetron (ZOFRAN) injection 4 mg  4 mg Intravenous Q6H PRN Wieting, Richard, MD      . QUEtiapine (SEROQUEL) tablet 100 mg  100 mg Oral QHS Lavella Hammock, MD      . QUEtiapine (SEROQUEL) tablet 25 mg  25 mg Oral BID PRN Lavella Hammock, MD        Musculoskeletal: Strength & Muscle Tone: decreased Gait & Station: Lying in a bed Patient leans: N/A  Psychiatric Specialty Exam: Physical  Exam  Constitutional: He appears well-developed. No distress.  HENT:  Head: Normocephalic and atraumatic.  Eyes: EOM are normal.  Cardiovascular: Normal rate.  Respiratory: Effort normal. No respiratory distress.  Neurological: He is alert.    Review of Systems  Unable to perform ROS: Dementia    Blood pressure 125/79, pulse (!) 109, temperature 98.2 F (36.8 C), temperature source Oral, resp. rate 18, height 5\' 8"  (1.727 m), weight 57.9 kg, SpO2 100 %.Body mass index is 19.41 kg/m.  General Appearance: Disheveled  Eye Contact:  Minimal  Speech:  Garbled  Volume:  Decreased  Mood:  Euthymic  Affect:  Blunt  Thought Process:  NA  Orientation:  Other:  Patient is only oriented to 1.  Thought Content:  NA  Suicidal Thoughts:  No  Homicidal Thoughts:  No  Memory:  Immediate;   Poor Recent;   Poor Remote;   Poor  Judgement:  Impaired  Insight:  Lacking  Psychomotor Activity:  Decreased  Concentration:  Concentration: Poor and Attention Span: Poor  Recall:  Poor  Fund of Knowledge:  Poor  Language:  Fair  Akathisia:  Negative  Handed:  Right  AIMS (if indicated):     Assets:  Resilience  ADL's:  Impaired  Cognition:  Impaired,  Severe  Sleep:   Adequate overnight   He had a CT scan of the head done on 4/13 that showed no evidence of acute intracranial abnormality.  There was progressive cerebral atrophy as well as chronic small vessel disease.  His EKG was sinus rhythm with multiple PVCs.   Treatment Plan Summary: Daily contact with patient to assess and evaluate symptoms  and progress in treatment and Medication management Continue home medications as per medication reconciliation. Psychiatry will continue to follow, and will evaluate need for as needed medication overnight, with attempt to schedule antipsychotics in order to be effective for patients behavioral needs related to dementia that will best serve his family at home in order to maintain patient and family  safety. Depakote level added to existing blood work, reveals low levels. Patient also appears to have an underlying delirium.  Encourage aggressive treatment of UTI with antibiotics. Medications reviewed with changes made below: Discontinue trazodone-will maximize other medication sedating effects. Discontinue Namenda, Aricept as patient's dementia has progressed significantly and patient is unlikely to have further benefit from these medications. Increase Seroquel to 100 mg at bedtime to prevent delirium, improve sleep efficacy. Change Depakote sprinkles 125 mg twice daily to Depakote ER 250 mg at bedtime to decrease agitation and enhance sedating effects and evening versus day. Provide Seroquel 25 mg twice daily as needed for agitation, hallucinations, increased confusion.  The patient's exam is notable for altered sensorium, perceptual disturbances, disorientation and cognitive deficits that appear markedly different than their baseline, suggesting a diagnosis of delirium.  Virtually any medical condition or physiologic stress can precipitate delirium in a susceptible individual, with risk increasing in those with: advanced age, sensory impairments, organic brain disease (stroke, dementia, Parkinsons), psychiatric illness, major chronic medical issues, prolonged hospitalizations, postoperative status, anemia, insomnia/disturbed sleep, and severe pain. Addressing the underlying medical condition and institution of preventative measures are recommended.  - Continue to monitor and treat underlying medical causes of delirium, including infection, electrolyte disturbances, etc. - Delirium precautions - Minimize/avoid deliriogenic meds including: anticholinergic, opiates, benzodiazepines           - Maintain hydration, oxygenation, nutrition           - Limit use of restraints and catheters           - Normalize sleep patterns by minimizing nighttime noise, light and interruptions by                 clustering care, opening blinds during the day           - Reorient the patient frequently, provide easily visible clock and calendar           - Provide sensory aids like glasses, hearing aids           - Encourage ambulation, regular activities and visitors to maintain cognitive stimulation   Unfortunately patient is suffering from advanced late onset Alzheimer's disease.  He has been on appropriate medications, but his disease has advanced.  The laboratories that have been obtained at this point are within normal limits.  He does not appear to be either suicidal or homicidal.  There is no evidence of psychosis at least at this point.    Disposition: : Patient does not fulfill criteria for inpatient psychiatric admission.  Recommendation of either transfer to a geriatric psychiatric facility or nursing home placement at this time.  Lavella Hammock, MD 10/30/2018 7:44 PM

## 2018-10-30 NOTE — Progress Notes (Signed)
Patient ID: Jaime Liner Sr., male   DOB: 1935-08-15, 83 y.o.   MRN: [id number]  [employer] PROGRESS NOTE  Jaime Villegas AYT:[id number] DOB: 1936/05/03 DOA: 10/25/2018 PCP: Jaime Ruths, MD  HPI/Subjective: Patient is calm this morning, on one-to-one sitter is discontinued.  Objective: Vitals:   10/30/18 1020 10/30/18 1612  BP: 127/71 125/79  Pulse: 81 (!) 109  Resp:  18  Temp:    SpO2:  100%    Intake/Output Summary (Last 24 hours) at 10/30/2018 1642 Last data filed at 10/30/2018 1500 Gross per 24 hour  Intake 175 ml  Output 600 ml  Net -425 ml   Filed Weights   10/27/18 0110 10/28/18 0500 10/30/18 0500  Weight: 57.6 kg 58.6 kg 57.9 kg    ROS: Review of Systems  Unable to perform ROS: Dementia   Exam: Physical Exam  Eyes: Pupils are equal, round, and reactive to light. Conjunctivae and lids are normal.  Neck: Carotid bruit is not present.  Cardiovascular: Regular rhythm, S1 normal and S2 normal.  Murmur heard.  Systolic murmur is present with a grade of 2/6. Respiratory: He has no decreased breath sounds. He has no wheezes. He has no rhonchi. He has no rales.  GI: Soft. Bowel sounds are normal. He exhibits no distension.  Musculoskeletal:     Right ankle: He exhibits swelling.     Left ankle: He exhibits swelling.  Neurological: He is alert.  Patient moves his arms on his own  Skin: Skin is warm. No rash noted. Nails show no clubbing.  Psychiatric:  Awake but cannot assess.      Data Reviewed: Basic Metabolic Panel: Recent Labs  Lab 10/25/18 2219 10/28/18 0432  NA 140 139  K 3.5 3.7  CL 102 102  CO2 31 29  GLUCOSE 106* 108*  BUN 25* 20  CREATININE 1.09 0.86  CALCIUM 8.3* 8.9   Liver Function Tests: Recent Labs  Lab 10/25/18 2219  AST 17  ALT 8  ALKPHOS 28*  BILITOT 0.3  PROT 6.2*  ALBUMIN 3.0*   CBC: Recent Labs  Lab 10/25/18 2219  WBC 4.5  NEUTROABS 1.9  HGB 11.1*  HCT 33.5*  MCV 96.8  PLT 128*    Cardiac Enzymes: Recent Labs  Lab 10/25/18 2219  TROPONINI <0.03     Recent Results (from the past 240 hour(s))  Urine culture     Status: Abnormal   Collection Time: 10/25/18 11:09 PM  Result Value Ref Range Status   Specimen Description   Final    URINE, RANDOM Performed at [facility], [address] [place], [place] [address]    Special Requests   Final    NONE Performed at [facility], [address], [place], [place] [address]    Culture (A)  Final    >=100,000 COLONIES/mL DIPHTHEROIDS(CORYNEBACTERIUM SPECIES) Standardized susceptibility testing for this organism is not available. Performed at [facility], [place] [address] [place], [place] [address]    Report Status 10/27/2018 FINAL  Final  SARS Coronavirus 2 (CEPHEID- Performed in [place] hospital lab), Hosp Order     Status: None   Collection Time: 10/26/18  1:40 AM  Result Value Ref Range Status   SARS Coronavirus 2 NEGATIVE NEGATIVE Final    Comment: (NOTE) If result is NEGATIVE SARS-CoV-2 target nucleic acids are NOT DETECTED. The SARS-CoV-2 RNA is generally detectable in upper and lower  respiratory specimens during the acute phase of infection. The lowest  concentration of SARS-CoV-2 viral copies this assay can detect is 250  copies / mL. A negative result does not preclude SARS-CoV-2 infection  and should not be used as the sole basis for treatment or other  patient management decisions.  A negative result may occur with  improper specimen collection / handling, submission of specimen other  than nasopharyngeal swab, presence of viral mutation(s) within the  areas targeted by this assay, and inadequate number of viral copies  (<250 copies / mL). A negative result must be combined with clinical  observations, patient history, and epidemiological information. If result is POSITIVE SARS-CoV-2 target nucleic acids are DETECTED. The SARS-CoV-2 RNA is generally detectable in  upper and lower  respiratory specimens dur ing the acute phase of infection.  Positive  results are indicative of active infection with SARS-CoV-2.  Clinical  correlation with patient history and other diagnostic information is  necessary to determine patient infection status.  Positive results do  not rule out bacterial infection or co-infection with other viruses. If result is PRESUMPTIVE POSTIVE SARS-CoV-2 nucleic acids MAY BE PRESENT.   A presumptive positive result was obtained on the submitted specimen  and confirmed on repeat testing.  While 2019 novel coronavirus  (SARS-CoV-2) nucleic acids may be present in the submitted sample  additional confirmatory testing may be necessary for epidemiological  and / or clinical management purposes  to differentiate between  SARS-CoV-2 and other Sarbecovirus currently known to infect humans.  If clinically indicated additional testing with an alternate test  methodology [phone]) is advised. The SARS-CoV-2 RNA is generally  detectable in upper and lower respiratory sp ecimens during the acute  phase of infection. The expected result is Negative. Fact Sheet for Patients:  StrictlyIdeas.no Fact Sheet for Healthcare Providers: BankingDealers.co.za This test is not yet approved or cleared by the [place] FDA and has been authorized for detection and/or diagnosis of SARS-CoV-2 by FDA under an Emergency Use Authorization (EUA).  This EUA will remain in effect (meaning this test can be used) for the duration of the COVID-19 declaration under Section 564(b)(1) of the Act, 21 U.S.C. section 360bbb-3(b)(1), unless the authorization is terminated or revoked sooner. Performed at [facility], [address], [place], [place] [address]      Studies: No results found.  Scheduled Meds: . aspirin EC  81 mg Oral Daily  . divalproex  250 mg Oral QHS  . docusate  100 mg Oral BID  .  enoxaparin (LOVENOX) injection  40 mg Subcutaneous Q24H  . feeding supplement (NEPRO CARB STEADY)  237 mL Oral BID BM  . metoprolol tartrate  12.5 mg Oral BID  . QUEtiapine  100 mg Oral QHS   Continuous Infusions: . sodium chloride 250 mL (10/29/18 0508)  . cefTRIAXone (ROCEPHIN)  IV 1 g (10/30/18 0503)    Assessment/Plan:   1. Agitation and weakness.  Restarted his normal Depakote and Seroquel.  Haldol as needed.  One-to-one sitter is discontinued. 2. Acute cystitis.  He is treated with Rocephin, urine culture report DIPHTHEROIDS.  Per Dr. Timmie Foerster, possible due to contamination. 3. Alzheimer's dementia with agitation, continue Seroquel and Depakote.  Continue Aricept and Namenda.  Behavioral medicine consult suggested advanced dementia, continue current treatment. 4. Lower extremity edema.  Place TED hose.  Ultrasound the left lower extremity negative for DVT. 5. History of congestive heart failure.  Unknown type, stable. Dehydration.  Oral fluid support.  Improving. Possible discharge to skilled nursing facility tomorrow. Code Status:  Code Status Orders  (From admission, onward)         Start     Ordered   10/26/18 0917  Do not attempt resuscitation (DNR)  Continuous    Question Answer Comment  In the event of cardiac or respiratory ARREST Do not call a "code blue"   In the event of cardiac or respiratory ARREST Do not perform Intubation, CPR, defibrillation or ACLS   In the event of cardiac or respiratory ARREST Use medication by any route, position, wound care, and other measures to relive pain and suffering. May use oxygen, suction and manual treatment of airway obstruction as needed for comfort.      10/26/18 1102        Code Status History    Date Active Date Inactive Code Status Order ID Comments User Context   10/26/2018 0424 10/26/2018 0917 Full Code [id number]  Harrie Foreman, MD Inpatient    Advance Directive Documentation     Most Recent Value  Type  of Advance Directive  Healthcare Power of Attorney  Pre-existing out of facility DNR order (yellow form or pink MOST form)  -  "MOST" Form in Place?  -     Family Communication: Discussed with his daughter.  According to his daughter, the patient's wife is unable to handle and take care of this patient at home. Disposition Plan: Social worker is trying to find skilled nursing facility placement.  Antibiotics:  Rocephin  Time spent: 27 minutes  [place]

## 2018-10-30 NOTE — Progress Notes (Signed)
Pharmacy Antibiotic Note  Jaime Villegas. is a 83 y.o. male admitted on 10/25/2018 with UTI.  Pharmacy has been consulted for meropenem dosing.  Plan: Will order meropenem 1 gm IV Q8H to start on 5/19 @ 2230.  Height: 5\' 8"  (172.7 cm) Weight: 127 lb 10.3 oz (57.9 kg) IBW/kg (Calculated) : 68.4  Temp (24hrs), Avg:100.1 F (37.8 C), Min:98.2 F (36.8 C), Max:103.1 F (39.5 C)  Recent Labs  Lab 10/25/18 2219 10/28/18 0432  WBC 4.5  --   CREATININE 1.09 0.86    Estimated Creatinine Clearance: 56.1 mL/min (by C-G formula based on SCr of 0.86 mg/dL).    Allergies  Allergen Reactions  . Aspirin   . Bee Pollen     Bee stings  . Darvon [Propoxyphene] Other (See Comments)    hallucinations  . Epinephrine Other (See Comments)    hallucinate  . Penicillins Swelling  . Sulfa Antibiotics Swelling  . Xylocaine [Lidocaine Hcl] Other (See Comments)    hallucinate    Antimicrobials this admission:   >>    >>   Dose adjustments this admission:   Microbiology results:  BCx:   UCx:    Sputum:    MRSA PCR:   Thank you for allowing pharmacy to be a part of this patient's care.  Abdulhamid Olgin D 10/30/2018 10:14 PM

## 2018-10-30 NOTE — Progress Notes (Signed)
New referral for outpatient palliative to follow at [facility] received from [facility]. Patient information faxed to referral. Flo Shanks BSN, RN, [place] [phone]

## 2018-10-30 NOTE — TOC Progression Note (Signed)
Transition of Care (TOC) - Progression Note    Patient Details  Name: Jaime RODEHEAVER Sr. MRN: [id number] Date of Birth: Jun 26, 1935  Transition of Care [facility]) CM/SW Contact  Elza Rafter, RN Phone Number: 10/30/2018, 10:51 AM  Clinical Narrative:   Damaris Schooner with Judeen Hammans [place]-daughter and notified her that [facility] in [place] has accepted patient.  Gave her the name and address and admissions coordinators number Ozella Almond [phone].  Asked for sitter to be discontinued yesterday but they were unable to do so.  Patient was trying to get OOB and pulling at lines.  Patient has been calm today and will re-attempt to discontinue sitter.  Notified Jannie at [employer] and [facility] [employer] [facility].      Expected Discharge Plan: Long Term Nursing Home Barriers to Discharge: Continued Medical Work up  Expected Discharge Plan and Services Expected Discharge Plan: [place]       Living arrangements for the past 2 months: Single Family Home Expected Discharge Date: 10/30/18                                     Social Determinants of Health (SDOH) Interventions    Readmission Risk Interventions Readmission Risk Prevention Plan 10/26/2018  Transportation Screening Complete  Some recent data might be hidden

## 2018-10-30 NOTE — TOC Progression Note (Signed)
Transition of Care (TOC) - Progression Note    Patient Details  Name: Jaime CASO Sr. MRN: [id number] Date of Birth: [birth year]  Transition of Care [facility]) CM/SW Contact  Elza Rafter, RN Phone Number: 10/30/2018, 11:59 AM  Clinical Narrative:   Notified Santiago Glad with [facility] that patient will need out patient palliative.     Expected Discharge Plan: Long Term Nursing Home Barriers to Discharge: Continued Medical Work up  Expected Discharge Plan and Services Expected Discharge Plan: [place]       Living arrangements for the past 2 months: Single Family Home Expected Discharge Date: 10/30/18                                     Social Determinants of Health (SDOH) Interventions    Readmission Risk Interventions Readmission Risk Prevention Plan 10/26/2018  Transportation Screening Complete  Some recent data might be hidden

## 2018-10-30 NOTE — Discharge Instructions (Signed)
Aspiration and Fall precaution.

## 2018-10-30 NOTE — Progress Notes (Signed)
Temperature reported 103.1 by NT, On recheck, 102 and 103 found both orally and axillary. MD Sudinin made aware. Tylenol suppository given, MD ordered blood cultures.

## 2018-10-30 NOTE — Progress Notes (Addendum)
Pt continues to sustain a 103 temperature. MD made aware, chest xray ordered and 15 mg IV Toradol for fever.On recheck,  fever still at 101.8 prior to administration. Pt has been unable to verbalize needs all shift. He is awake but lethargic and does not engage with staff. Pt suctioned as needed. PO medications not given due to aspiration risk. MD made aware.

## 2018-10-31 ENCOUNTER — Inpatient Hospital Stay: Payer: Medicare Other

## 2018-10-31 LAB — CBC
HCT: 38.5 % — ABNORMAL LOW (ref 39.0–52.0)
Hemoglobin: 13 g/dL (ref 13.0–17.0)
MCH: 32.3 pg (ref 26.0–34.0)
MCHC: 33.8 g/dL (ref 30.0–36.0)
MCV: 95.8 fL (ref 80.0–100.0)
Platelets: 185 10*3/uL (ref 150–400)
RBC: 4.02 MIL/uL — ABNORMAL LOW (ref 4.22–5.81)
RDW: 13.8 % (ref 11.5–15.5)
WBC: 12.5 10*3/uL — ABNORMAL HIGH (ref 4.0–10.5)
nRBC: 0 % (ref 0.0–0.2)

## 2018-10-31 LAB — BLOOD GAS, ARTERIAL
Acid-base deficit: 0.3 mmol/L (ref 0.0–2.0)
Bicarbonate: 22.3 mmol/L (ref 20.0–28.0)
FIO2: 1
O2 Saturation: 97.8 %
Patient temperature: 38
pCO2 arterial: 30 mmHg — ABNORMAL LOW (ref 32.0–48.0)
pH, Arterial: 7.46 — ABNORMAL HIGH (ref 7.350–7.450)
pO2, Arterial: 99 mmHg (ref 83.0–108.0)

## 2018-10-31 LAB — BASIC METABOLIC PANEL
Anion gap: 11 (ref 5–15)
BUN: 42 mg/dL — ABNORMAL HIGH (ref 8–23)
CO2: 21 mmol/L — ABNORMAL LOW (ref 22–32)
Calcium: 9.1 mg/dL (ref 8.9–10.3)
Chloride: 107 mmol/L (ref 98–111)
Creatinine, Ser: 1.33 mg/dL — ABNORMAL HIGH (ref 0.61–1.24)
GFR calc Af Amer: 58 mL/min — ABNORMAL LOW (ref >60)
GFR calc non Af Amer: 50 mL/min — ABNORMAL LOW (ref >60)
Glucose, Bld: 166 mg/dL — ABNORMAL HIGH (ref 70–99)
Potassium: 3.9 mmol/L (ref 3.5–5.1)
Sodium: 139 mmol/L (ref 135–145)

## 2018-10-31 LAB — MRSA PCR SCREENING: MRSA by PCR: NEGATIVE

## 2018-10-31 LAB — LACTIC ACID, PLASMA
Lactic Acid, Venous: 2.3 mmol/L (ref 0.5–1.9)
Lactic Acid, Venous: 1.7 mmol/L (ref 0.5–1.9)

## 2018-10-31 LAB — CK: Total CK: 44 U/L — ABNORMAL LOW (ref 49–397)

## 2018-10-31 MED ORDER — METRONIDAZOLE IN NACL 5-0.79 MG/ML-% IV SOLN
500.0000 mg | Freq: Three times a day (TID) | INTRAVENOUS | Status: DC
  Administered 2018-10-31 – 2018-11-01 (×2): 500 mg via INTRAVENOUS
  Filled 2018-10-31 (×4): qty 100

## 2018-10-31 MED ORDER — LEVALBUTEROL HCL 0.63 MG/3ML IN NEBU
0.6300 mg | INHALATION_SOLUTION | Freq: Four times a day (QID) | RESPIRATORY_TRACT | Status: DC | PRN
  Administered 2018-10-31: 0.63 mg via RESPIRATORY_TRACT
  Filled 2018-10-31: qty 3

## 2018-10-31 MED ORDER — SODIUM CHLORIDE 0.9 % IV SOLN
1.0000 g | Freq: Two times a day (BID) | INTRAVENOUS | Status: DC
  Administered 2018-10-31 (×2): 1 g via INTRAVENOUS
  Filled 2018-10-31 (×3): qty 1

## 2018-10-31 MED ORDER — SODIUM CHLORIDE 0.9 % IV SOLN
2.0000 g | Freq: Two times a day (BID) | INTRAVENOUS | Status: DC
  Administered 2018-10-31: 2 g via INTRAVENOUS
  Filled 2018-10-31 (×3): qty 2

## 2018-10-31 MED ORDER — VANCOMYCIN HCL IN DEXTROSE 750-5 MG/150ML-% IV SOLN
750.0000 mg | INTRAVENOUS | Status: DC
  Filled 2018-10-31: qty 150

## 2018-10-31 MED ORDER — METOPROLOL TARTRATE 5 MG/5ML IV SOLN
2.5000 mg | Freq: Once | INTRAVENOUS | Status: AC
  Administered 2018-11-01: 2.5 mg via INTRAVENOUS
  Filled 2018-10-31: qty 5

## 2018-10-31 MED ORDER — SODIUM CHLORIDE 0.9 % IV SOLN
INTRAVENOUS | Status: DC
  Administered 2018-10-31: 15:00:00 via INTRAVENOUS

## 2018-10-31 NOTE — Care Management Important Message (Signed)
Important Message  Patient Details  Name: Jaime BAGSHAW Sr. MRN: [id number] Date of Birth: 1936-02-26   Medicare Important Message Given:  Yes  Reviewed with Almond Lint, daughter, at [phone].  Aware of right and in agreement with discharge plan.  Copy of Medicare IM sent securely to [place] email upon request: [email or web address].    Dannette Barbara 10/31/2018, 11:07 AM

## 2018-10-31 NOTE — Progress Notes (Signed)
Pharmacy Antibiotic Note  Jaime Villegas. is a 83 y.o. male admitted on 10/25/2018 with pneumonia.  Pharmacy has been consulted for vanc/meropenem dosing.  Plan: Patient received vanc 1.5g IV load x 1 and meropenem 1g IV x 1  Vancomycin 750 mg IV Q 24 hrs. Goal AUC 400-550. Expected AUC: 450.2 SCr used: 1.33 Cssmin: 11.6  Will dose adjust meropenem to 1g IV q12h per CrCl 26 - 50 ml/min. Will continue to monitor renal fx and s/sx of infx.  Height: 5\' 8"  (172.7 cm) Weight: 127 lb 10.3 oz (57.9 kg) IBW/kg (Calculated) : 68.4  Temp (24hrs), Avg:100.3 F (37.9 C), Min:98.2 F (36.8 C), Max:103.1 F (39.5 C)  Recent Labs  Lab 10/25/18 2219 10/28/18 0432 10/30/18 2232 10/31/18 0047  WBC 4.5  --   --  12.5*  CREATININE 1.09 0.86  --  1.33*  LATICACIDVEN  --   --  2.6* 2.3*    Estimated Creatinine Clearance: 36.3 mL/min (A) (by C-G formula based on SCr of 1.33 mg/dL (H)).    Allergies  Allergen Reactions  . Aspirin   . Bee Pollen     Bee stings  . Darvon [Propoxyphene] Other (See Comments)    hallucinations  . Epinephrine Other (See Comments)    hallucinate  . Penicillins Swelling  . Sulfa Antibiotics Swelling  . Xylocaine [Lidocaine Hcl] Other (See Comments)    hallucinate    Thank you for allowing pharmacy to be a part of this patient's care.  Tobie Lords, PharmD, BCPS Clinical Pharmacist 10/31/2018

## 2018-10-31 NOTE — Progress Notes (Signed)
Advanced Care Plan.  Purpose of Encounter: Palliative care/hospice care. Parties in Attendance: The patient, his daughter and me. Patient's Decisional Capacity: No. Medical Story: Jaime Liner Sr. is an 83 y.o. male with history of nose and left ear cancer, CHF, CKD, advanced Alzheimer's dementia.  He is admitted for agitation and weakness.  He developed sepsis due to aspiration pneumonia, lactic acidosis, acute respiratory failure with hypoxia and renal failure due to dehydration.  I discussed with the patient's daughter about his current condition, poor prognosis and palliative care/hospice care.  She voiced understanding and agree to palliative care follow-up.. Goals of Care Determinations: Hospice care or palliative care. Plan:  Code Status: DNR. Time spent discussing advance care planning: 17 minutes.

## 2018-10-31 NOTE — Progress Notes (Signed)
PT Cancellation Note  Patient Details Name: Jaime MUCCI Sr. MRN: [id number] DOB: 24-Aug-1935   Cancelled Treatment:    Reason Eval/Treat Not Completed: Fatigue/lethargy limiting ability to participate.  Pt sleeping in bed upon PT arrival and did not wake to vc's or tactile cues (nurse notified and already aware).  Will re-attempt PT treatment session at a later date/time.  Leitha Bleak, PT 10/31/18, 3:23 PM [phone]

## 2018-10-31 NOTE — Consult Note (Signed)
[facility] Face-to-Face Psychiatry Consult   Reason for Consult: Confusion and agitation. Referring Physician: Dr. Bridgett Larsson Patient Identification: Jaime Liner Sr. MRN:  [id number] Principal Diagnosis: UTI (urinary tract infection) Diagnosis:  Principal Problem:   UTI (urinary tract infection) Active Problems:   Dementia ([place])   Weakness   Palliative care by specialist   Goals of care, counseling/discussion  Patient is seen, chart is reviewed. Total Time spent with patient: 15 minutes  Subjective: Patient mumbles, minimally verbal.  HPI:  Jaime HOBBY Sr. is a 83 y.o. male patient with past medical history of late onset Alzheimer's dementia, CHF and CKD presents to the emergency department with his family due to agitation.  They report the patient has not slept in 3 days and has become increasingly aggressive and difficult to soothe or make comfortable.  The patient is unable to contribute to his history as he was initially belligerent prior to receiving sedation in the emergency department.  The patient's wife also reports that he has had leg swelling as well.  Initial laboratory evaluation was unremarkable except for urinalysis which was consistent with infection.  The patient was given a dose of ceftriaxone in the emergency department.   On my examination today, patient is laying in bed.  He is alert, and mumbles incomprehensibly and does not appear to understand even basic questions.  He has a history of being severely hard of hearing.  Unable to assess orientation.  Patient has maintained for 24 hours without a sitter.  Nursing reports no behavioral concerns.  Patient is taking his medication with pills that can be crushed.  Nursing states that patient has developed an aspiration pneumonia and will remain hospitalized at this time.  Can transition to sublingual dissolving tablets if needed to prevent further aspiration.  Per record review Past Psychiatric History: At least according to  the electronic medical record his only diagnosis is the advanced Alzheimer's disease.  I would assume that the mirtazapine was for appetite stimulation as well as sleep. His current medications include Depakote DR 125 mg p.o. twice daily, Neurontin 100 mg p.o. every morning and 400 mg p.o. nightly, lorazepam 1 mg p.o. x1, Namenda 10 mg p.o. twice daily and mirtazapine 15 mg p.o. nightly.    Review of the electronic medical record revealed on 08/06/2018 when he was seen at the [facility] neurology clinic his [place] had been discontinued as well as his Aricept and Depakote.  He had been written for gabapentin as in the chart, and his mirtazapine had been started at 7.5 mg p.o. nightly.    Risk to Self:  Minimal secondary to agitation Risk to Others:  Minimal secondary to agitation Prior Inpatient Therapy:  None Prior Outpatient Therapy:  None  Past Medical History:  Past Medical History:  Diagnosis Date  . Allergy   . Back problem   . Cancer ([place]) 2006, 2013   nose, left ear  . CHF (congestive heart failure) ([place])   . Chronic kidney disease   . Dementia ([place])   . Joint ache     Past Surgical History:  Procedure Laterality Date  . BACK SURGERY    . EXTERNAL EAR SURGERY Left 2013   cancer  . HERNIA REPAIR     left inguinal x 2 right inguinal x 1  . HERNIA REPAIR Right 07/09/14   Bard onlay polypropylene mesh for recurrent hernia repair  . KNEE SURGERY     Family History: History reviewed. No pertinent family history. Family Psychiatric  History: Noncontributory Social History:  Social History   Substance and Sexual Activity  Alcohol Use No  . Alcohol/week: 0.0 standard drinks     Social History   Substance and Sexual Activity  Drug Use No    Social History   Socioeconomic History  . Marital status: Married    Spouse name: Not on file  . Number of children: Not on file  . Years of education: Not on file  . Highest education level: Not on file  Occupational History  .  Not on file  Social Needs  . Financial resource strain: Not on file  . Food insecurity:    Worry: Not on file    Inability: Not on file  . Transportation needs:    Medical: Not on file    Non-medical: Not on file  Tobacco Use  . Smoking status: Former Smoker    Packs/day: 1.00    Years: 40.00    Pack years: 40.00    Types: Cigarettes    Last attempt to quit: 06/13/1973    Years since quitting: 45.4  . Smokeless tobacco: Never Used  Substance and Sexual Activity  . Alcohol use: No    Alcohol/week: 0.0 standard drinks  . Drug use: No  . Sexual activity: Not on file  Lifestyle  . Physical activity:    Days per week: Not on file    Minutes per session: Not on file  . Stress: Not on file  Relationships  . Social connections:    Talks on phone: Not on file    Gets together: Not on file    Attends religious service: Not on file    Active member of club or organization: Not on file    Attends meetings of clubs or organizations: Not on file    Relationship status: Not on file  Other Topics Concern  . Not on file  Social History Narrative  . Not on file   Additional Social History:   Lives with wife, who now has difficulty caring for him. Family is requesting assistance for patient to be placed in skilled nursing facility at this time.   Allergies:   Allergies  Allergen Reactions  . Aspirin   . Bee Pollen     Bee stings  . Darvon [Propoxyphene] Other (See Comments)    hallucinations  . Epinephrine Other (See Comments)    hallucinate  . Penicillins Swelling  . Sulfa Antibiotics Swelling  . Xylocaine [Lidocaine Hcl] Other (See Comments)    hallucinate    Labs:  Results for orders placed or performed during the hospital encounter of 10/25/18 (from the past 48 hour(s))  Valproic acid level     Status: Abnormal   Collection Time: 10/30/18  2:20 PM  Result Value Ref Range   Valproic Acid Lvl 36 (L) 50.0 - 100.0 ug/mL    Comment: Performed at [facility],  [address], [place], [place] [address]  CULTURE, BLOOD (ROUTINE X 2) w Reflex to ID Panel     Status: None (Preliminary result)   Collection Time: 10/30/18  9:06 PM  Result Value Ref Range   Specimen Description BLOOD BLOOD RIGHT HAND    Special Requests      BOTTLES DRAWN AEROBIC AND ANAEROBIC Blood Culture adequate volume   Culture      NO GROWTH < 12 HOURS Performed at [facility], [address] [place], [place] [address]    Report Status PENDING   CULTURE, BLOOD (ROUTINE X  2) w Reflex to ID Panel     Status: None (Preliminary result)   Collection Time: 10/30/18  9:13 PM  Result Value Ref Range   Specimen Description BLOOD BLOOD LEFT FOREARM    Special Requests      BOTTLES DRAWN AEROBIC AND ANAEROBIC Blood Culture adequate volume   Culture      NO GROWTH < 12 HOURS Performed at [facility], [address] [place], [place] [address]    Report Status PENDING   Lactic acid, plasma     Status: Abnormal   Collection Time: 10/30/18 10:32 PM  Result Value Ref Range   Lactic Acid, Venous 2.6 (HH) 0.5 - 1.9 mmol/L    Comment: CRITICAL RESULT CALLED TO, READ BACK BY AND VERIFIED WITH MERYCLL TURNER @2300  10/30/18 MJU Performed at [facility], [address] [place], [place] [address]   CBC     Status: Abnormal   Collection Time: 10/31/18 12:47 AM  Result Value Ref Range   WBC 12.5 (H) 4.0 - 10.5 K/uL   RBC 4.02 (L) 4.22 - 5.81 MIL/uL   Hemoglobin 13.0 13.0 - 17.0 g/dL   HCT 38.5 (L) 39.0 - 52.0 %   MCV 95.8 80.0 - 100.0 fL   MCH 32.3 26.0 - 34.0 pg   MCHC 33.8 30.0 - 36.0 g/dL   RDW 13.8 11.5 - 15.5 %   Platelets 185 150 - 400 K/uL   nRBC 0.0 0.0 - 0.2 %    Comment: Performed at [facility], [address] [place], [place] [address]  Lactic acid, plasma     Status: Abnormal   Collection Time: 10/31/18 12:47 AM  Result Value Ref Range   Lactic Acid, Venous 2.3 (HH) 0.5 - 1.9 mmol/L    Comment: CRITICAL RESULT CALLED TO,  READ BACK BY AND VERIFIED WITH KATE MURRY @211  10/31/2018 TTG Performed at [facility], [address] [place], [place] [address]   Basic metabolic panel     Status: Abnormal   Collection Time: 10/31/18 12:47 AM  Result Value Ref Range   Sodium 139 135 - 145 mmol/L   Potassium 3.9 3.5 - 5.1 mmol/L   Chloride 107 98 - 111 mmol/L   CO2 21 (L) 22 - 32 mmol/L   Glucose, Bld 166 (H) 70 - 99 mg/dL   BUN 42 (H) 8 - 23 mg/dL   Creatinine, Ser 1.33 (H) 0.61 - 1.24 mg/dL   Calcium 9.1 8.9 - 10.3 mg/dL   GFR calc non Af Amer 50 (L) >60 mL/min   GFR calc Af Amer 58 (L) >60 mL/min   Anion gap 11 5 - 15    Comment: Performed at [facility], [address] [place], [place] [address]  CK     Status: Abnormal   Collection Time: 10/31/18  2:22 AM  Result Value Ref Range   Total CK 44 (L) 49 - 397 U/L    Comment: Performed at [facility], [address] [place], [place] [address]  Lactic acid, plasma     Status: None   Collection Time: 10/31/18  5:35 AM  Result Value Ref Range   Lactic Acid, Venous 1.7 0.5 - 1.9 mmol/L    Comment: Performed at [facility], [address] [place], [place] [address]  MRSA PCR Screening     Status: None   Collection Time: 10/31/18 11:43 AM  Result Value Ref Range   MRSA by PCR NEGATIVE NEGATIVE  Comment:        The GeneXpert MRSA Assay (FDA approved for NASAL specimens only), is one component of a comprehensive MRSA colonization surveillance program. It is not intended to diagnose MRSA infection nor to guide or monitor treatment for MRSA infections. Performed at [facility], [address], [place], [place] [address]     Current Facility-Administered Medications  Medication Dose Route Frequency Provider Last Rate Last Dose  . 0.9 %  sodium chloride infusion   Intravenous PRN Demetrios Loll, MD 10 mL/hr at 10/31/18 1622    . 0.9 %  sodium chloride infusion   Intravenous Continuous Demetrios Loll, MD 75  mL/hr at 10/31/18 1622    . acetaminophen (TYLENOL) tablet 650 mg  650 mg Oral Q6H PRN Loletha Grayer, MD       Or  . acetaminophen (TYLENOL) suppository 650 mg  650 mg Rectal Q6H PRN Loletha Grayer, MD   650 mg at 10/30/18 2044  . aspirin EC tablet 81 mg  81 mg Oral Daily Loletha Grayer, MD   81 mg at 10/30/18 0844  . ceFEPIme (MAXIPIME) 2 g in sodium chloride 0.9 % 100 mL IVPB  2 g Intravenous Q12H Demetrios Loll, MD      . docusate (COLACE) 50 MG/5ML liquid 100 mg  100 mg Oral BID Demetrios Loll, MD   100 mg at 10/30/18 0844  . enoxaparin (LOVENOX) injection 40 mg  40 mg Subcutaneous Q24H Loletha Grayer, MD   40 mg at 10/29/18 2051  . feeding supplement (NEPRO CARB STEADY) liquid 237 mL  237 mL Oral BID BM Wieting, Richard, MD   237 mL at 10/28/18 1154  . haloperidol lactate (HALDOL) injection 5 mg  5 mg Intravenous Q6H PRN Lance Coon, MD   5 mg at 10/29/18 2245  . hydrALAZINE (APRESOLINE) injection 10 mg  10 mg Intravenous Q6H PRN Demetrios Loll, MD      . metoprolol tartrate (LOPRESSOR) tablet 12.5 mg  12.5 mg Oral BID Demetrios Loll, MD   12.5 mg at 10/30/18 0933  . metroNIDAZOLE (FLAGYL) IVPB 500 mg  500 mg Intravenous Robynn Pane, MD      . ondansetron [facility]) tablet 4 mg  4 mg Oral Q6H PRN Loletha Grayer, MD       Or  . ondansetron [facility]) injection 4 mg  4 mg Intravenous Q6H PRN Wieting, Richard, MD      . QUEtiapine (SEROQUEL) tablet 100 mg  100 mg Oral QHS Lavella Hammock, MD      . QUEtiapine (SEROQUEL) tablet 25 mg  25 mg Oral BID PRN Lavella Hammock, MD        Musculoskeletal: Strength & Muscle Tone: decreased Gait & Station: Lying in a bed Patient leans: N/A  Psychiatric Specialty Exam: Physical Exam  Constitutional: He appears well-developed. No distress.  HENT:  Head: Normocephalic and atraumatic.  Eyes: EOM are normal.  Cardiovascular: Normal rate.  Respiratory: Effort normal. No respiratory distress.  Neurological: He is alert.    Review of Systems  Unable  to perform ROS: Dementia    Blood pressure (!) 142/74, pulse (!) 113, temperature 98.8 F (37.1 C), temperature source Oral, resp. rate 17, height 5\' 8"  (1.727 m), weight 57 kg, SpO2 (!) 87 %.Body mass index is 19.11 kg/m.  General Appearance: Disheveled  Eye Contact:  Minimal  Speech:  Garbled  Volume:  Decreased  Mood:  Euthymic  Affect:  Blunt  Thought Process:  NA  Orientation:  Other:  Patient is only oriented to 1.  Thought Content:  NA  Suicidal Thoughts:  No  Homicidal Thoughts:  No  Memory:  Immediate;   Poor Recent;   Poor Remote;   Poor  Judgement:  Impaired  Insight:  Lacking  Psychomotor Activity:  Decreased  Concentration:  Concentration: Poor and Attention Span: Poor  Recall:  Poor  Fund of Knowledge:  Poor  Language:  Fair  Akathisia:  Negative  Handed:  Right  AIMS (if indicated):     Assets:  Resilience  ADL's:  Impaired  Cognition:  Impaired,  Severe  Sleep:   Adequate overnight   He had a CT scan of the head done on 4/13 that showed no evidence of acute intracranial abnormality.  There was progressive cerebral atrophy as well as chronic small vessel disease.  His EKG was sinus rhythm with multiple PVCs.   Treatment Plan Summary: Daily contact with patient to assess and evaluate symptoms and progress in treatment and Medication management Continue home medications as per medication reconciliation. Psychiatry will continue to follow, and will evaluate need for as needed medication overnight, with attempt to schedule antipsychotics in order to be effective for patients behavioral needs related to dementia that will best serve his family at home in order to maintain patient and family safety. Depakote level added to existing blood work, reveals low levels. Patient also appears to have an underlying delirium.  Encourage aggressive treatment of UTI with antibiotics. Medications reviewed with changes made below: Discontinue trazodone-will maximize other  medication sedating effects. Discontinue Namenda, Aricept as patient's dementia has progressed significantly and patient is unlikely to have further benefit from these medications. Increase Seroquel to 100 mg at bedtime to prevent delirium, improve sleep efficacy. Change Depakote sprinkles 125 mg twice daily to Depakote ER 250 mg at bedtime to decrease agitation and enhance sedating effects and evening versus day. Will discontinue Depakote 10/31/2018 and reassess patient.  If agitated at can add, versus increase Seroquel dose. Attempt to minimize p.o. medications in order to decrease aspiration risk.  Can transition to sublingual psychotropic for behavioral issues if patient has difficulty with aspiration precautions. Provide Seroquel 25 mg twice daily as needed for agitation, hallucinations, increased confusion.  The patient's exam is notable for altered sensorium, perceptual disturbances, disorientation and cognitive deficits that appear markedly different than their baseline, suggesting a diagnosis of delirium.  Virtually any medical condition or physiologic stress can precipitate delirium in a susceptible individual, with risk increasing in those with: advanced age, sensory impairments, organic brain disease (stroke, dementia, Parkinsons), psychiatric illness, major chronic medical issues, prolonged hospitalizations, postoperative status, anemia, insomnia/disturbed sleep, and severe pain. Addressing the underlying medical condition and institution of preventative measures are recommended.  - Continue to monitor and treat underlying medical causes of delirium, including infection, electrolyte disturbances, etc. - Delirium precautions - Minimize/avoid deliriogenic meds including: anticholinergic, opiates, benzodiazepines           - Maintain hydration, oxygenation, nutrition           - Limit use of restraints and catheters           - Normalize sleep patterns by minimizing nighttime noise, light and  interruptions by                clustering care, opening blinds during the day           - Reorient the patient frequently, provide easily visible clock and calendar           -  Provide sensory aids like glasses, hearing aids           - Encourage ambulation, regular activities and visitors to maintain cognitive stimulation   Unfortunately patient is suffering from advanced late onset Alzheimer's disease.  He has been on appropriate medications, but his disease has advanced.  The laboratories that have been obtained at this point are within normal limits.  He does not appear to be either suicidal or homicidal.  There is no evidence of psychosis at least at this point.    Disposition: : Patient does not fulfill criteria for inpatient psychiatric admission.  Recommendation of either transfer to a geriatric psychiatric facility or nursing home placement at this time.  Lavella Hammock, MD 10/31/2018 6:38 PM

## 2018-10-31 NOTE — Progress Notes (Signed)
RN asked RT to assess patient. Patient on 6L [place] with a SAT of 76-78%. Patient placed on NRBM, SATs improved to 95%. Oral suctioning removed moderated amount of clear/tan thick secretions. NP to bedside. Per NP, ABG and PRN breathing treatment ordered.

## 2018-10-31 NOTE — Progress Notes (Signed)
Pharmacy Antibiotic Note  Jaime Villegas. is a 83 y.o. male admitted on 10/25/2018 with possible pneumonia and high fevers. Pharmacy has been consulted for cefepime dosing. No h/o ESBL stop meropenem and d/c vancomycin for neg MRSA PCR.  Had high fevers evening on 5/19 - was receiving PRN IV haldol.    On ceftriaxone 5/15 to 5/19 Meropenem 5/19 to 5/20   Plan: Cefepime 2gm IV q12h Flagyl 500mg  IV q8h - monitor renal function - f/u ability to de-escalation antibiotics or change to PO.   - f/u repeat bcx - currently NGTD  Height: 5\' 8"  (172.7 cm) Weight: 125 lb 11.2 oz (57 kg) IBW/kg (Calculated) : 68.4  Temp (24hrs), Avg:100.2 F (37.9 C), Min:98.2 F (36.8 C), Max:103.1 F (39.5 C)  Recent Labs  Lab 10/25/18 2219 10/28/18 0432 10/30/18 2232 10/31/18 0047 10/31/18 0535  WBC 4.5  --   --  12.5*  --   CREATININE 1.09 0.86  --  1.33*  --   LATICACIDVEN  --   --  2.6* 2.3* 1.7    Estimated Creatinine Clearance: 35.7 mL/min (A) (by C-G formula based on SCr of 1.33 mg/dL (H)).    Allergies  Allergen Reactions  . Aspirin   . Bee Pollen     Bee stings  . Darvon [Propoxyphene] Other (See Comments)    hallucinations  . Epinephrine Other (See Comments)    hallucinate  . Penicillins Swelling  . Sulfa Antibiotics Swelling  . Xylocaine [Lidocaine Hcl] Other (See Comments)    hallucinate    Antimicrobials this admission:  Dose adjustments this admission:   Microbiology results:  5/19 BCx: NGTD   5/14 UCx:  diptheroids  MRSA PCR: neg  Thank you for allowing pharmacy to be a part of this patient's care.  Doreene Eland, PharmD, BCPS.   Work Cell: [phone] 10/31/2018 5:09 PM
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
## 2018-10-31 NOTE — Progress Notes (Signed)
SLP Cancellation Note  Patient Details Name: Jaime Villegas. MRN: [id number] DOB: 11/22/1935   Cancelled treatment:       Reason Eval/Treat Not Completed: Patient not medically ready;Fatigue/lethargy limiting ability to participate(chart reviewed; consulted NSG re: pt's status today). Pt was drowsy, not fully attentive at lunch meal today. Recommended to NSG to hold on any po's until pt's presentation improved for safe oral intake. MD concerned for aspiration pneumonia - pt did have suspected aspiration of a liquid medication per NSG report ~2 nights ago.  ST services recommends continued dysphagia diet w/ Nectar consistency liquids w/ strict aspiration precautions. Will f/u tomorrow. NSG agreed.     Orinda Kenner, [place], CCC-SLP Watson,Katherine 10/31/2018, 3:25 PM

## 2018-10-31 NOTE — TOC Progression Note (Addendum)
Transition of Care (TOC) - Progression Note    Patient Details  Name: Jaime DOCKENDORF Sr. MRN: [id number] Date of Birth: May 04, 1936  Transition of Care [facility]) CM/SW Contact  Elza Rafter, RN Phone Number: 10/31/2018, 9:13 AM  Clinical Narrative:   Plan to discharge to [facility] in [place].  Developed fevers and is now on antibiotics for PNA.  Will notify [facility] admission coordinator.  No sitter overnight.    Expected Discharge Plan: Long Term Nursing Home Barriers to Discharge: Continued Medical Work up  Expected Discharge Plan and Services Expected Discharge Plan: [place]       Living arrangements for the past 2 months: Single Family Home Expected Discharge Date: 10/30/18                                     Social Determinants of Health (SDOH) Interventions    Readmission Risk Interventions Readmission Risk Prevention Plan 10/26/2018  Transportation Screening Complete  Some recent data might be hidden

## 2018-11-01 DIAGNOSIS — J690 Pneumonitis due to inhalation of food and vomit: Secondary | ICD-10-CM

## 2018-11-01 LAB — BASIC METABOLIC PANEL
Anion gap: 6 (ref 5–15)
BUN: 44 mg/dL — ABNORMAL HIGH (ref 8–23)
CO2: 22 mmol/L (ref 22–32)
Calcium: 8.4 mg/dL — ABNORMAL LOW (ref 8.9–10.3)
Chloride: 116 mmol/L — ABNORMAL HIGH (ref 98–111)
Creatinine, Ser: 1.09 mg/dL (ref 0.61–1.24)
GFR calc Af Amer: >60 mL/min (ref >60)
GFR calc non Af Amer: >60 mL/min (ref >60)
Glucose, Bld: 130 mg/dL — ABNORMAL HIGH (ref 70–99)
Potassium: 4 mmol/L (ref 3.5–5.1)
Sodium: 144 mmol/L (ref 135–145)

## 2018-11-01 LAB — CBC
HCT: 35.4 % — ABNORMAL LOW (ref 39.0–52.0)
Hemoglobin: 11.6 g/dL — ABNORMAL LOW (ref 13.0–17.0)
MCH: 32.2 pg (ref 26.0–34.0)
MCHC: 32.8 g/dL (ref 30.0–36.0)
MCV: 98.3 fL (ref 80.0–100.0)
Platelets: 125 10*3/uL — ABNORMAL LOW (ref 150–400)
RBC: 3.6 MIL/uL — ABNORMAL LOW (ref 4.22–5.81)
RDW: 14 % (ref 11.5–15.5)
WBC: 6.7 10*3/uL (ref 4.0–10.5)
nRBC: 0 % (ref 0.0–0.2)

## 2018-11-01 MED ORDER — MORPHINE SULFATE (PF) 2 MG/ML IV SOLN: 1.0000 mg | INTRAVENOUS | Status: DC | PRN

## 2018-11-01 MED ORDER — GLYCOPYRROLATE 0.2 MG/ML IJ SOLN
0.2000 mg | INTRAMUSCULAR | Status: DC | PRN
  Administered 2018-11-01: 0.2 mg via INTRAVENOUS
  Filled 2018-11-01 (×3): qty 1

## 2018-11-01 MED ORDER — LORAZEPAM 2 MG/ML IJ SOLN
0.5000 mg | INTRAMUSCULAR | Status: DC | PRN
  Administered 2018-11-06 – 2018-11-07 (×4): 0.5 mg via INTRAVENOUS
  Filled 2018-11-01 (×4): qty 1

## 2018-11-01 MED ORDER — METOPROLOL TARTRATE 5 MG/5ML IV SOLN
2.5000 mg | Freq: Once | INTRAVENOUS | Status: AC
  Administered 2018-11-01: 2.5 mg via INTRAVENOUS
  Filled 2018-11-01: qty 5

## 2018-11-01 MED ORDER — MORPHINE SULFATE (PF) 2 MG/ML IV SOLN
1.0000 mg | Freq: Four times a day (QID) | INTRAVENOUS | Status: DC
  Administered 2018-11-01 – 2018-11-07 (×19): 1 mg via INTRAVENOUS
  Filled 2018-11-01 (×20): qty 1

## 2018-11-01 MED ORDER — MORPHINE SULFATE (PF) 2 MG/ML IV SOLN
1.0000 mg | INTRAVENOUS | Status: DC | PRN
  Administered 2018-11-01 – 2018-11-07 (×10): 2 mg via INTRAVENOUS
  Filled 2018-11-01 (×10): qty 1

## 2018-11-01 NOTE — Progress Notes (Signed)
Jaime Villegas Progress Note  11/01/2018 9:38 AM Jaime Liner Sr.  MRN:  [id number]  Principal Problem: UTI (urinary tract infection) Diagnosis: Principal Problem:   UTI (urinary tract infection) Active Problems:   Dementia ([place])   Weakness   Palliative care by specialist   Goals of care, counseling/discussion   Total Time spent with patient: 15 minutes  Time spent in care call to patient family members (daughter, Jolyn Lent, [phone]) Reviewed with patient's daughter plan of care for comfort measures.  Discussed discharge planning and need for medications should patient develop agitated behavior at home.  Daughter conveys that family has decided that patient would be discharged to hospice services.  They have been appreciative of the time that they were allowed to visit with patient in the hospital today.  All questions answered, and support and condolences provided.  Subjective: Patient mumbles, minimally verbal.  HPI:  Jaime BAIK Sr. is a 83 y.o. male patient with past medical history of late onset Alzheimer's dementia, CHF and CKD presents to the emergency department with his family due to agitation. They report the patient has not slept in 3 days and has become increasingly aggressive and difficult to soothe or make comfortable. The patient is unable to contribute to his history as he was initially belligerent prior to receiving sedation in the emergency department. The patient's wife also reports that he has had leg swelling as well. Initial laboratory evaluation was unremarkable except for urinalysis which was consistent with infection. The patient was given a dose of ceftriaxone in the emergency department.   On my examination today, patient is laying in bed.  He is appears alert, with eyes open however today he is nonverbal.   Per record review Past Psychiatric History: At least according to the electronic medical record his only diagnosis is the advanced  Alzheimer's disease.  I would assume that the mirtazapine was for appetite stimulation as well as sleep. His current medications include Depakote DR 125 mg p.o. twice daily, Neurontin 100 mg p.o. every morning and 400 mg p.o. nightly, lorazepam 1 mg p.o. x1, Namenda 10 mg p.o. twice daily and mirtazapine 15 mg p.o. nightly.    Review of the electronic medical record revealed on 08/06/2018 when he was seen at the [facility] neurology clinic his Lenox Ponds had been discontinued as well as his Aricept and Depakote.  He had been written for gabapentin as in the chart, and his mirtazapine had been started at 7.5 mg p.o. nightly.    Risk to Self:  Minimal secondary to agitation Risk to Others:  Minimal secondary to agitation Prior Inpatient Therapy:  None Prior Outpatient Therapy:  None   Past Medical History:  Past Medical History:  Diagnosis Date  . Allergy   . Back problem   . Cancer ([place]) 2006, 2013   nose, left ear  . CHF (congestive heart failure) ([place])   . Chronic kidney disease   . Dementia ([place])   . Joint ache     Past Surgical History:  Procedure Laterality Date  . BACK SURGERY    . EXTERNAL EAR SURGERY Left 2013   cancer  . HERNIA REPAIR     left inguinal x 2 right inguinal x 1  . HERNIA REPAIR Right 07/09/14   Bard onlay polypropylene mesh for recurrent hernia repair  . KNEE SURGERY     Family History: History reviewed. No pertinent family history. Family Psychiatric  History: Noncontributory  Social History:  Social History   Substance  and Sexual Activity  Alcohol Use No  . Alcohol/week: 0.0 standard drinks     Social History   Substance and Sexual Activity  Drug Use No    Social History   Socioeconomic History  . Marital status: Married    Spouse name: Not on file  . Number of children: Not on file  . Years of education: Not on file  . Highest education level: Not on file  Occupational History  . Not on file  Social Needs  . Financial resource strain: Not  on file  . Food insecurity:    Worry: Not on file    Inability: Not on file  . Transportation needs:    Medical: Not on file    Non-medical: Not on file  Tobacco Use  . Smoking status: Former Smoker    Packs/day: 1.00    Years: 40.00    Pack years: 40.00    Types: Cigarettes    Last attempt to quit: 06/13/1973    Years since quitting: 45.4  . Smokeless tobacco: Never Used  Substance and Sexual Activity  . Alcohol use: No    Alcohol/week: 0.0 standard drinks  . Drug use: No  . Sexual activity: Not on file  Lifestyle  . Physical activity:    Days per week: Not on file    Minutes per session: Not on file  . Stress: Not on file  Relationships  . Social connections:    Talks on phone: Not on file    Gets together: Not on file    Attends religious service: Not on file    Active member of club or organization: Not on file    Attends meetings of clubs or organizations: Not on file    Relationship status: Not on file  Other Topics Concern  . Not on file  Social History Narrative  . Not on file   Additional Social History:      Lives with wife, who now has difficulty caring for him. Family is requesting assistance for patient to be placed in skilled nursing facility at this time.                      Sleep: Good  Appetite:  Poor  Current Medications: Current Facility-Administered Medications  Medication Dose Route Frequency Provider Last Rate Last Dose  . 0.9 %  sodium chloride infusion   Intravenous PRN Demetrios Loll, Villegas 10 mL/hr at 10/31/18 1622    . acetaminophen (TYLENOL) tablet 650 mg  650 mg Oral Q6H PRN Loletha Grayer, Villegas       Or  . acetaminophen (TYLENOL) suppository 650 mg  650 mg Rectal Q6H PRN Loletha Grayer, Villegas   650 mg at 11/01/18 0030  . aspirin EC tablet 81 mg  81 mg Oral Daily Loletha Grayer, Villegas   81 mg at 10/30/18 0844  . ceFEPIme (MAXIPIME) 2 g in sodium chloride 0.9 % 100 mL IVPB  2 g Intravenous Q12H Demetrios Loll, Villegas 200 mL/hr at 10/31/18  2343 2 g at 10/31/18 2343  . docusate (COLACE) 50 MG/5ML liquid 100 mg  100 mg Oral BID Demetrios Loll, Villegas   Stopped at 10/31/18 2207  . enoxaparin (LOVENOX) injection 40 mg  40 mg Subcutaneous Q24H Loletha Grayer, Villegas   40 mg at 10/31/18 2232  . feeding supplement (NEPRO CARB STEADY) liquid 237 mL  237 mL Oral BID BM Loletha Grayer, Villegas   237 mL at 10/28/18 1154  . haloperidol lactate (  HALDOL) injection 5 mg  5 mg Intravenous Q6H PRN Lance Coon, Villegas   5 mg at 10/29/18 2245  . hydrALAZINE (APRESOLINE) injection 10 mg  10 mg Intravenous Q6H PRN Demetrios Loll, Villegas      . levalbuterol Penne Lash) nebulizer solution 0.63 mg  0.63 mg Nebulization Q6H PRN Gardiner Barefoot H, NP   0.63 mg at 10/31/18 2353  . metoprolol tartrate (LOPRESSOR) tablet 12.5 mg  12.5 mg Oral BID Demetrios Loll, Villegas   Stopped at 10/31/18 2207  . metroNIDAZOLE (FLAGYL) IVPB 500 mg  500 mg Intravenous Robynn Pane, Villegas 100 mL/hr at 11/01/18 0550 500 mg at 11/01/18 0550  . ondansetron (ZOFRAN) tablet 4 mg  4 mg Oral Q6H PRN Loletha Grayer, Villegas       Or  . ondansetron [facility]) injection 4 mg  4 mg Intravenous Q6H PRN Wieting, Richard, Villegas      . QUEtiapine (SEROQUEL) tablet 100 mg  100 mg Oral QHS Lavella Hammock, Villegas   Stopped at 10/31/18 2208  . QUEtiapine (SEROQUEL) tablet 25 mg  25 mg Oral BID PRN Lavella Hammock, Villegas        Lab Results:  Results for orders placed or performed during the hospital encounter of 10/25/18 (from the past 48 hour(s))  Valproic acid level     Status: Abnormal   Collection Time: 10/30/18  2:20 PM  Result Value Ref Range   Valproic Acid Lvl 36 (L) 50.0 - 100.0 ug/mL    Comment: Performed at [facility], [address] [place], [place] [address]  CULTURE, BLOOD (ROUTINE X 2) w Reflex to ID Panel     Status: None (Preliminary result)   Collection Time: 10/30/18  9:06 PM  Result Value Ref Range   Specimen Description BLOOD BLOOD RIGHT HAND    Special Requests      BOTTLES DRAWN AEROBIC AND ANAEROBIC  Blood Culture adequate volume   Culture      NO GROWTH 2 DAYS Performed at [facility], [address] [place], [place] [address]    Report Status PENDING   CULTURE, BLOOD (ROUTINE X 2) w Reflex to ID Panel     Status: None (Preliminary result)   Collection Time: 10/30/18  9:13 PM  Result Value Ref Range   Specimen Description BLOOD BLOOD LEFT FOREARM    Special Requests      BOTTLES DRAWN AEROBIC AND ANAEROBIC Blood Culture adequate volume   Culture      NO GROWTH 2 DAYS Performed at [facility], [address] [place], [place] [address]    Report Status PENDING   Lactic acid, plasma     Status: Abnormal   Collection Time: 10/30/18 10:32 PM  Result Value Ref Range   Lactic Acid, Venous 2.6 (HH) 0.5 - 1.9 mmol/L    Comment: CRITICAL RESULT CALLED TO, READ BACK BY AND VERIFIED WITH MERYCLL TURNER @2300  10/30/18 MJU Performed at [facility], [address] [place], [place] [address]   CBC     Status: Abnormal   Collection Time: 10/31/18 12:47 AM  Result Value Ref Range   WBC 12.5 (H) 4.0 - 10.5 K/uL   RBC 4.02 (L) 4.22 - 5.81 MIL/uL   Hemoglobin 13.0 13.0 - 17.0 g/dL   HCT 38.5 (L) 39.0 - 52.0 %   MCV 95.8 80.0 - 100.0 fL   MCH 32.3 26.0 - 34.0 pg   MCHC 33.8 30.0 - 36.0 g/dL   RDW 13.8  11.5 - 15.5 %   Platelets 185 150 - 400 K/uL   nRBC 0.0 0.0 - 0.2 %    Comment: Performed at [facility], [address] [place], [place] [address]  Lactic acid, plasma     Status: Abnormal   Collection Time: 10/31/18 12:47 AM  Result Value Ref Range   Lactic Acid, Venous 2.3 (HH) 0.5 - 1.9 mmol/L    Comment: CRITICAL RESULT CALLED TO, READ BACK BY AND VERIFIED WITH KATE MURRY @211  10/31/2018 TTG Performed at [facility], [address] [place], [place] [address]   Basic metabolic panel     Status: Abnormal   Collection Time: 10/31/18 12:47 AM  Result Value Ref Range   Sodium 139 135 - 145 mmol/L   Potassium 3.9 3.5 - 5.1 mmol/L    Chloride 107 98 - 111 mmol/L   CO2 21 (L) 22 - 32 mmol/L   Glucose, Bld 166 (H) 70 - 99 mg/dL   BUN 42 (H) 8 - 23 mg/dL   Creatinine, Ser 1.33 (H) 0.61 - 1.24 mg/dL   Calcium 9.1 8.9 - 10.3 mg/dL   GFR calc non Af Amer 50 (L) >60 mL/min   GFR calc Af Amer 58 (L) >60 mL/min   Anion gap 11 5 - 15    Comment: Performed at [facility], [address] [place], [place] [address]  CK     Status: Abnormal   Collection Time: 10/31/18  2:22 AM  Result Value Ref Range   Total CK 44 (L) 49 - 397 U/L    Comment: Performed at [facility], [address] [place], [place] [address]  Lactic acid, plasma     Status: None   Collection Time: 10/31/18  5:35 AM  Result Value Ref Range   Lactic Acid, Venous 1.7 0.5 - 1.9 mmol/L    Comment: Performed at [facility], [address] [place], [place] [address]  MRSA PCR Screening     Status: None   Collection Time: 10/31/18 11:43 AM  Result Value Ref Range   MRSA by PCR NEGATIVE NEGATIVE    Comment:        The GeneXpert MRSA Assay (FDA approved for NASAL specimens only), is one component of a comprehensive MRSA colonization surveillance program. It is not intended to diagnose MRSA infection nor to guide or monitor treatment for MRSA infections. Performed at [facility], [address] [place], [place] [address]   Blood gas, arterial     Status: Abnormal   Collection Time: 10/31/18 11:27 PM  Result Value Ref Range   FIO2 1.00    Delivery systems NON-REBREATHER OXYGEN MASK    pH, Arterial 7.46 (H) 7.350 - 7.450   pCO2 arterial 30 (L) 32.0 - 48.0 mmHg   pO2, Arterial 99 83.0 - 108.0 mmHg   Bicarbonate 22.3 20.0 - 28.0 mmol/L   Acid-base deficit 0.3 0.0 - 2.0 mmol/L   O2 Saturation 97.8 %   Patient temperature 38.0    Collection site RIGHT RADIAL    Sample type ARTERIAL DRAW    Allens test (pass/fail) PASS PASS    Comment: Performed at [facility], [address] [place], [place]  [address]  Basic metabolic panel     Status: Abnormal   Collection Time: 11/01/18  5:00 AM  Result Value Ref Range   Sodium 144 135 - 145 mmol/L   Potassium 4.0 3.5 - 5.1 mmol/L   Chloride 116 (H) 98 -  111 mmol/L   CO2 22 22 - 32 mmol/L   Glucose, Bld 130 (H) 70 - 99 mg/dL   BUN 44 (H) 8 - 23 mg/dL   Creatinine, Ser 1.09 0.61 - 1.24 mg/dL   Calcium 8.4 (L) 8.9 - 10.3 mg/dL   GFR calc non Af Amer >60 >60 mL/min   GFR calc Af Amer >60 >60 mL/min   Anion gap 6 5 - 15    Comment: Performed at [facility], [address] [place], [place] [address]  CBC     Status: Abnormal   Collection Time: 11/01/18  5:00 AM  Result Value Ref Range   WBC 6.7 4.0 - 10.5 K/uL   RBC 3.60 (L) 4.22 - 5.81 MIL/uL   Hemoglobin 11.6 (L) 13.0 - 17.0 g/dL   HCT 35.4 (L) 39.0 - 52.0 %   MCV 98.3 80.0 - 100.0 fL   MCH 32.2 26.0 - 34.0 pg   MCHC 32.8 30.0 - 36.0 g/dL   RDW 14.0 11.5 - 15.5 %   Platelets 125 (L) 150 - 400 K/uL   nRBC 0.0 0.0 - 0.2 %    Comment: Performed at [facility], [address] [place], [place] [address]    Blood Alcohol level:  No results found for: [facility]  Metabolic Disorder Labs: No results found for: HGBA1C, MPG No results found for: PROLACTIN No results found for: CHOL, TRIG, HDL, CHOLHDL, VLDL, LDLCALC  Physical Findings: AIMS:  , ,  ,  ,    CIWA:    COWS:     Musculoskeletal: Strength & Muscle Tone: decreased Gait & Station: unable to stand Patient leans: N/A  Psychiatric Specialty Exam: Physical Exam  Constitutional: He appears well-developed. No distress.  HENT:  Head: Normocephalic and atraumatic.  Cardiovascular: Normal rate.  Respiratory: Effort normal. No respiratory distress.    Review of Systems  Unable to perform ROS: Dementia    Blood pressure 140/74, pulse 74, temperature 99.6 F (37.6 C), temperature source Oral, resp. rate (!) 25, height 5\' 8"  (1.727 m), weight 57.7 kg, SpO2 99 %.Body mass index is 19.33 kg/m.  General  Appearance: Neat  Eye Contact:  Poor  Speech:  Garbled  Volume:  Decreased  Mood:  calm  Affect:  Congruent  Thought Process:  Disorganized  Orientation:  NA  Thought Content:  NA  Suicidal Thoughts:  No  Homicidal Thoughts:  No  Memory:  NA  Judgement:  NA  Insight:  NA  Psychomotor Activity:  Decreased  Concentration:  Attention Span: NA  Recall:  NA  Fund of Knowledge:  NA  Language:  NA  Akathisia:  NA  Handed:  Right  AIMS (if indicated):     Assets:  Financial Resources/Insurance Housing Social Support  ADL's:  Impaired  Cognition:  Impaired,  Severe  Sleep:   resting well   He had a CT scan of the head done on 4/13 that showed no evidence of acute intracranial abnormality.  There was progressive cerebral atrophy as well as chronic small vessel disease.  His EKG was sinus rhythm with multiple PVCs.   Treatment Plan Summary: Plan Psychiatry will sign off, as patient will be accepted into hospice care.  Please know that reconsult is available if needed.    Lavella Hammock, Villegas 11/01/2018, 9:38 AM

## 2018-11-01 NOTE — Progress Notes (Signed)
Speech Therapy Note: reviewed chart notes; consulted NSG re: pt's status this morning. NSG reported giving pills/medications in applesauce as recommended post evaluation; aspiration precautions posted in room.  NA was feeding pt at lunch meal; Dysphagia level 1 diet w/ Nectar liquids. Pt had consumed ~90% of the meal and a Nectar liquid via cup w/ NA feeding him. Noted lingual smacking to clear but when ready he turned to the NA and opened his mouth for a tsp bite. No overt s/s of aspiration were noted during visit in room. NA reported no observed difficulty swallowing the lunch meal.  ST services will continue to f/u w/ pt's status next 1-3 days. Recommend continued feeding support and aspiration precautions.  This was a late entry of a missed progress note from 10/30/2018 entered today.   Orinda Kenner, [place], CCC-SLP
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
## 2018-11-01 NOTE — Progress Notes (Signed)
Talked to patient's son and daughter and updated them about patient's plan of care.

## 2018-11-01 NOTE — Progress Notes (Signed)
Daily Progress Note   Patient Name: Jaime SCHNEPF Sr.       Date: 11/01/2018 DOB: 04/08/1936  Age: 83 y.o. MRN#: [id number] Attending Physician: Demetrios Loll, MD Primary Care Physician: Kirk Ruths, MD Admit Date: 10/25/2018  Reason for Consultation/Follow-up: Establishing goals of care  Subjective: Patient opens eyes spontaneously but remains drowsy. Does not follow commands or show meaningful engagement with visitors at bedside. Remains on NRB mask with intermittent episodes of tachypnea. Shallow respirations. RN to give prn morphine and transition off of NRB mask.   GOC:  Palliative NP met with wife, daughter, son, and daughter-in-law at bedside. Discussed course of hospitalization including diagnoses, interventions, and episode of aspiration leading to aspiration pneumonia and poor prognosis. Family tearful but accepting that he is nearing EOL. Wife confirms decisions against heroic measures at EOL including DNR/DNI. Wife speaks of wanting him to be "comfortable" with no suffering.   Discussed transition to comfort measures only in order to allow comfort, peace, dignity at EOL. Educated on EOL expectations and medications as needed for comfort and relief from suffering. Wife does not want further life-prolonging interventions. We discussed transition off of NRB mask once RN gives morphine. Discussed discontinuation of interventions not aimed at comfort including antibiotics. Family understands and agrees with plan of care.   Wife declines chaplain services. She does that they are a spiritual family and belief that he is going to heaven.   Discussed possible transition to hospice facility if appropriate with plan to keep him inpatient today. Family agrees.   Emotional/spiritual  support provided. Therapeutic listening as family share stories of their father and challenges with caring for him with progressive Alzheimer's disease.   PMT contact information given.   Length of Stay: 4  Current Medications: Scheduled Meds:  . QUEtiapine  100 mg Oral QHS    Continuous Infusions: . sodium chloride 10 mL/hr at 10/31/18 1622    PRN Meds: sodium chloride, acetaminophen **OR** acetaminophen, glycopyrrolate, haloperidol lactate, levalbuterol, LORazepam, morphine injection, ondansetron **OR** ondansetron (ZOFRAN) IV, QUEtiapine  Physical Exam Vitals signs and nursing note reviewed.  Constitutional:      Appearance: He is cachectic. He is ill-appearing.  HENT:     Head: Normocephalic and atraumatic.  Cardiovascular:     Rate and Rhythm: Normal rate.  Pulmonary:  Effort: Tachypnea and accessory muscle usage present. No respiratory distress.     Breath sounds: Decreased breath sounds present.     Comments: RN to give prn morphine Abdominal:     Tenderness: There is no abdominal tenderness.  Skin:    General: Skin is warm and dry.     Coloration: Skin is pale.  Neurological:     Mental Status: He is lethargic.  Psychiatric:        Attention and Perception: He is inattentive.        Speech: He is noncommunicative.        Cognition and Memory: Cognition is impaired.            Vital Signs: BP 140/74 (BP Location: Right Arm)   Pulse 74   Temp 99.6 F (37.6 C) (Oral)   Resp (!) 25   Ht _0  (1.727 m)   Wt 57.7 kg   SpO2 99%   BMI 19.33 kg/m  SpO2: SpO2: 99 % O2 Device: O2 Device: NRB O2 Flow Rate: O2 Flow Rate (L/min): 15 L/min  Intake/output summary:   Intake/Output Summary (Last 24 hours) at 11/01/2018 1316 Last data filed at 11/01/2018 8119 Gross per 24 hour  Intake 997.52 ml  Output 450 ml  Net 547.52 ml   LBM: Last BM Date: 10/31/18 Baseline Weight: Weight: 77.1 kg Most recent weight: Weight: 57.7 kg       Palliative Assessment/Data:  PPS 10%   Flowsheet Rows     Most Recent Value  Intake Tab  Referral Department  Hospitalist  Unit at Time of Referral  Med/Surg Unit  Palliative Care Primary Diagnosis  Neurology  Date Notified  10/29/18  Palliative Care Type  New Palliative care  Reason for referral  Clarify Goals of Care  Date of Admission  10/25/18  Date first seen by Palliative Care  10/30/18  # of days IP prior to Palliative referral  4  Clinical Assessment  Palliative Performance Scale Score  10%  Psychosocial & Spiritual Assessment  Palliative Care Outcomes  Patient/Family meeting held?  Yes  Who was at the meeting?  wife, daughter, son, DIL  Palliative Care Outcomes  Clarified goals of care, Provided end of life care assistance, Provided psychosocial or spiritual support, ACP counseling assistance, Improved pain interventions, Improved non-pain symptom therapy, Counseled regarding hospice, Changed to focus on comfort      Patient Active Problem List   Diagnosis Date Noted  . Palliative care by specialist   . Goals of care, counseling/discussion   . UTI (urinary tract infection) 10/28/2018  . Weakness 10/26/2018  . Dementia ([place]) 09/24/2018  . Right inguinal hernia 06/20/2014    Palliative Care Assessment & Plan   Patient Profile: 83 y.o. male  with past medical history of Alzheimer's dementia, CHF, CKD admitted on 10/25/2018 with agitation from home. Family reports patient has not slept in 3 days and became increasingly aggressive and difficult to manage. In ED, urinalysis consistent with UTI and given ceftriazone x1. Required safety sitter this admission due to agitation. Psych evaluation with plan to continue home dose medications. Patient receiving Seroquel, Depakote, Aricept, and Namenda. Requires higher level of care and family requesting SNF placement. Palliative medicine consultation for goals of care.   Assessment: Alzheimer's dementia Agitation Weakness Acute cystitis Sepsis Aspiration  pneumonia Acute respiratory failure with hypoxia Lactic acidosis ARF due to dehydration Hx of CHF  Recommendations/Plan:  After f/u GOC discussion with family, transition to comfort measures only.  Family  confirms NO heroic measures at EOL. DNR/DNI  Discontinued interventions not aimed at comfort.   Symptom management  Morphine 1-90m IV q1h prn pain/dyspnea/tachypnea/air hunger  Haldol 523mIV q6h prn agitation  Ativan 0.12m41mV q4h prn anxiety/agitation  Robinul 0.2mg42m q4h prn secretions  RN to give prn morphine and transition off NRB mask for EOL care.   Comfort feeds per patient/family request. Family understands risk for aspiration.  Continue good oral care.   Poor prognosis. Hours-days. May pass quickly once NRB mask removed.   Goals of Care and Additional Recommendations:  Limitations on Scope of Treatment: Full Comfort Care  Code Status: DNR   Code Status Orders  (From admission, onward)         Start     Ordered   10/26/18 0917  Do not attempt resuscitation (DNR)  Continuous    Question Answer Comment  In the event of cardiac or respiratory ARREST Do not call a "code blue"   In the event of cardiac or respiratory ARREST Do not perform Intubation, CPR, defibrillation or ACLS   In the event of cardiac or respiratory ARREST Use medication by any route, position, wound care, and other measures to relive pain and suffering. May use oxygen, suction and manual treatment of airway obstruction as needed for comfort.      10/26/18 [id number]    Code Status History    Date Active Date Inactive Code Status Order ID Comments User Context   10/26/2018 0424 10/26/2018 0917 Full Code [id number] Foreman Inpatient    Advance Directive Documentation     Most Recent Value  Type of Advance Directive  Healthcare Power of Attorney  Pre-existing out of facility DNR order (yellow form or pink MOST form)  -  "MOST" Form in Place?  -       Prognosis:    Hours - Days  Discharge Planning:  To Be Determined: Hospital death vs. Inpatient hospice  Care plan was discussed with RN, Dr. ChenBridgett Larssonmily including wife, daughter, son  Thank you for allowing the Palliative Medicine Team to assist in the care of this patient.   Time In: 0920- 1040- Time Out: 0940 1110 Total Time 50 Prolonged Time Billed  no      Greater than 50%  of this time was spent counseling and coordinating care related to the above assessment and plan.  MegaIhor DowP-C Palliative Medicine Team  Phone: [phone]: [phone] contact Palliative Medicine Team phone at [phone] questions and concerns.

## 2018-11-01 NOTE — Progress Notes (Signed)
Physical Therapy Discharge Patient Details Name: Jaime RICHART Sr. MRN: [id number] DOB: 27-Jun-1935 Today's Date: 11/01/2018 Time:  -     Patient discharged from PT services secondary to physical order and comfort care transition.     Chesley Noon 11/01/2018, 11:25 AM

## 2018-11-02 DIAGNOSIS — R0602 Shortness of breath: Secondary | ICD-10-CM

## 2018-11-02 DIAGNOSIS — Z515 Encounter for palliative care: Secondary | ICD-10-CM

## 2018-11-02 DIAGNOSIS — N3000 Acute cystitis without hematuria: Principal | ICD-10-CM
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
## 2018-11-02 NOTE — Progress Notes (Signed)
Daily Progress Note   Patient Name: Jaime PADIN Sr.       Date: 11/02/2018 DOB: 1935-06-25  Age: 83 y.o. MRN#: [id number] Attending Physician: Demetrios Loll, MD Primary Care Physician: Kirk Ruths, MD Admit Date: 10/25/2018  Reason for Consultation/Follow-up: Establishing goals of care and Terminal Care  Subjective: Per RN, patient comfortable, unresponsive. No concerns.  Length of Stay: 5  Current Medications: Scheduled Meds:  .  morphine injection  1 mg Intravenous Q6H    Continuous Infusions:   PRN Meds: acetaminophen **OR** acetaminophen, glycopyrrolate, haloperidol lactate, LORazepam, morphine injection, ondansetron **OR** ondansetron (ZOFRAN) IV    Vital Signs: BP 134/77 (BP Location: Right Arm)   Pulse 100   Temp 99.3 F (37.4 C) (Oral)   Resp 20   Ht 5\' 8"  (1.727 m)   Wt 55 kg   SpO2 91%   BMI 18.43 kg/m  SpO2: SpO2: 91 % O2 Device: O2 Device: Room Air O2 Flow Rate: O2 Flow Rate (L/min): 15 L/min  Intake/output summary:   Intake/Output Summary (Last 24 hours) at 11/02/2018 1132 Last data filed at 11/02/2018 0006 Gross per 24 hour  Intake 0 ml  Output 250 ml  Net -250 ml   LBM: Last BM Date: 10/28/18 Baseline Weight: Weight: 77.1 kg Most recent weight: Weight: 55 kg       Palliative Assessment/Data: PPS 10%    Flowsheet Rows     Most Recent Value  Intake Tab  Referral Department  Hospitalist  Unit at Time of Referral  Med/Surg Unit  Palliative Care Primary Diagnosis  Neurology  Date Notified  10/29/18  Palliative Care Type  New Palliative care  Reason for referral  Clarify Goals of Care  Date of Admission  10/25/18  Date first seen by Palliative Care  10/30/18  # of days Palliative referral response time  1 Day(s)  # of days IP prior to  Palliative referral  4  Clinical Assessment  Palliative Performance Scale Score  10%  Psychosocial & Spiritual Assessment  Palliative Care Outcomes  Patient/Family meeting held?  Yes  Who was at the meeting?  wife, daughter, son, DIL  Palliative Care Outcomes  Clarified goals of care, Provided end of life care assistance, Provided psychosocial or spiritual support, ACP counseling assistance, Improved pain interventions, Improved non-pain symptom therapy, Counseled regarding hospice, Changed to focus on comfort      Patient Active Problem List   Diagnosis Date Noted  . Aspiration pneumonia of both lungs ([place])   . Palliative care by specialist   . Terminal care   . UTI (urinary tract infection) 10/28/2018  . Weakness 10/26/2018  . Dementia ([place]) 09/24/2018  . Right inguinal hernia 06/20/2014    Palliative Care Assessment & Plan   HPI: 83 y.o.malewith past medical history of Alzheimer's dementia, CHF, CKDadmitted on 5/14/2020with agitation from home.Family reports patient has not slept in 3 days and became increasingly aggressive and difficult to manage. In ED, urinalysis consistent with UTI and given ceftriazone x1. Required safety sitter this admission due to agitation. Psych evaluation with plan to continue home dose medications. Patient receiving Seroquel, Depakote, Aricept, and Namenda. Requires higher level of care and family requesting SNF placement.  Palliative medicine consultation for goals of care.  Assessment: Follow up for symptom check and terminal care. No concerns per nurse - patient unresponsive and resting comfortably. Patient is receiving scheduled morphine with additional PRN doses occasionally. No family at bedside but nurse has spoken with family a couple of times today and provided update.  Recommendations/Plan:  Comfort measures only  Continue morphine, haldol, ativan, and robinul for symptom management   Goals of Care and Additional Recommendations:   Limitations on Scope of Treatment: Full Comfort Care  Code Status:  DNR  Prognosis:   Hours - Days  Discharge Planning:  Anticipated Hospital Death  Care plan was discussed with RN  Thank you for allowing the Palliative Medicine Team to assist in the care of this patient.   Total Time 15 minutes Prolonged Time Billed  no     The above conversation was completed via telephone due to the visitor restrictions during the COVID-19 pandemic. Thorough chart review and discussion with necessary members of the care team was completed as part of assessment. All issues were discussed and addressed but no physical exam was performed.     Greater than 50%  of this time was spent counseling and coordinating care related to the above assessment and plan.  Juel Burrow, DNP, [facility] Palliative Medicine Team Team Phone # [phone]  Pager [phone]

## 2018-11-03 NOTE — Progress Notes (Signed)
Report called to Bailey Mech, RN on 1C. Patient belongings gathered and transferred with patient to room 102. Family called and notified of transfer. Patient transfered to unit. Patient is off unit. Care relinquished.

## 2018-11-03 NOTE — Plan of Care (Signed)
  Problem: Education: Goal: Knowledge of General Education information will improve Description Including pain rating scale, medication(s)/side effects and non-pharmacologic comfort measures Outcome: Progressing   

## 2018-11-03 NOTE — Progress Notes (Signed)
Dr. Bridgett Larsson notified that patient's family is at bed side and requesting to speak with him.

## 2018-11-03 NOTE — Progress Notes (Signed)
Patient ID: Jaime Liner Sr., male   DOB: 03-04-1936, 83 y.o.   MRN: [id number]  [facility] PROGRESS NOTE  Adebayo Ensminger MEQ:[id number] DOB: 03-04-1936 DOA: 10/25/2018 PCP: Kirk Ruths, MD  HPI/Subjective: Patient is more awake, opened eyes, moves her arms, on comfort care. Objective: Vitals:   11/03/18 0735 11/03/18 1123  BP: (!) 159/90 (!) 145/91  Pulse: (!) 106 100  Resp: 19   Temp: (!) 97.4 F (36.3 C)   SpO2: (!) 89% 96%    Intake/Output Summary (Last 24 hours) at 11/03/2018 1247 Last data filed at 11/02/2018 2300 Gross per 24 hour  Intake -  Output 500 ml  Net -500 ml   Filed Weights   11/01/18 0348 11/02/18 0420 11/03/18 2979  Weight: 57.7 kg 55 kg 53 kg    ROS: Review of Systems  Unable to perform ROS: Dementia   Exam: Physical Exam  Eyes: Pupils are equal, round, and reactive to light. Conjunctivae and lids are normal.  Neck: Carotid bruit is not present.  Cardiovascular: Regular rhythm, S1 normal and S2 normal.  Murmur heard.  Systolic murmur is present with a grade of 2/6. Respiratory: He has no decreased breath sounds. He has no wheezes. He has no rhonchi. He has no rales.  GI: Soft. Bowel sounds are normal. He exhibits no distension.  Musculoskeletal:     Right ankle: He exhibits no swelling.  Neurological:  Awake, demented and confused.  Skin: Skin is warm. No rash noted. Nails show no clubbing.      Data Reviewed: Basic Metabolic Panel: Recent Labs  Lab 10/28/18 0432 10/31/18 0047 11/01/18 0500  NA 139 139 144  K 3.7 3.9 4.0  CL 102 107 116*  CO2 29 21* 22  GLUCOSE 108* 166* 130*  BUN 20 42* 44*  CREATININE 0.86 1.33* 1.09  CALCIUM 8.9 9.1 8.4*   Liver Function Tests: No results for input(s): AST, ALT, ALKPHOS, BILITOT, PROT, ALBUMIN in the last 168 hours. CBC: Recent Labs  Lab 10/31/18 0047 11/01/18 0500  WBC 12.5* 6.7  HGB 13.0 11.6*  HCT 38.5* 35.4*  MCV 95.8 98.3  PLT 185 125*   Cardiac  Enzymes: Recent Labs  Lab 10/31/18 0222  CKTOTAL 44*     Recent Results (from the past 240 hour(s))  Urine culture     Status: Abnormal   Collection Time: 10/25/18 11:09 PM  Result Value Ref Range Status   Specimen Description   Final    URINE, RANDOM Performed at [facility], [address] [place], [place] [address]    Special Requests   Final    NONE Performed at [facility], [address] [place], [place] [address]    Culture (A)  Final    >=100,000 COLONIES/mL DIPHTHEROIDS(CORYNEBACTERIUM SPECIES) Standardized susceptibility testing for this organism is not available. Performed at [facility], [place] [address], [place], [place] [address]    Report Status 10/27/2018 FINAL  Final  SARS Coronavirus 2 (CEPHEID- Performed in [place] hospital lab), Hosp Order     Status: None   Collection Time: 10/26/18  1:40 AM  Result Value Ref Range Status   SARS Coronavirus 2 NEGATIVE NEGATIVE Final    Comment: (NOTE) If result is NEGATIVE SARS-CoV-2 target nucleic acids are NOT DETECTED. The SARS-CoV-2 RNA is generally detectable in upper and lower  respiratory specimens during the acute phase of infection. The lowest  concentration of SARS-CoV-2 viral copies this assay can detect is  250  copies / mL. A negative result does not preclude SARS-CoV-2 infection  and should not be used as the sole basis for treatment or other  patient management decisions.  A negative result may occur with  improper specimen collection / handling, submission of specimen other  than nasopharyngeal swab, presence of viral mutation(s) within the  areas targeted by this assay, and inadequate number of viral copies  (<250 copies / mL). A negative result must be combined with clinical  observations, patient history, and epidemiological information. If result is POSITIVE SARS-CoV-2 target nucleic acids are DETECTED. The SARS-CoV-2 RNA is generally detectable in upper and lower   respiratory specimens dur ing the acute phase of infection.  Positive  results are indicative of active infection with SARS-CoV-2.  Clinical  correlation with patient history and other diagnostic information is  necessary to determine patient infection status.  Positive results do  not rule out bacterial infection or co-infection with other viruses. If result is PRESUMPTIVE POSTIVE SARS-CoV-2 nucleic acids MAY BE PRESENT.   A presumptive positive result was obtained on the submitted specimen  and confirmed on repeat testing.  While 2019 novel coronavirus  (SARS-CoV-2) nucleic acids may be present in the submitted sample  additional confirmatory testing may be necessary for epidemiological  and / or clinical management purposes  to differentiate between  SARS-CoV-2 and other Sarbecovirus currently known to infect humans.  If clinically indicated additional testing with an alternate test  methodology [phone]) is advised. The SARS-CoV-2 RNA is generally  detectable in upper and lower respiratory sp ecimens during the acute  phase of infection. The expected result is Negative. Fact Sheet for Patients:  StrictlyIdeas.no Fact Sheet for Healthcare Providers: BankingDealers.co.za This test is not yet approved or cleared by the [place] FDA and has been authorized for detection and/or diagnosis of SARS-CoV-2 by FDA under an Emergency Use Authorization (EUA).  This EUA will remain in effect (meaning this test can be used) for the duration of the COVID-19 declaration under Section 564(b)(1) of the Act, 21 U.S.C. section 360bbb-3(b)(1), unless the authorization is terminated or revoked sooner. Performed at [facility], [address] [place], [place] [address]   CULTURE, BLOOD (ROUTINE X 2) w Reflex to ID Panel     Status: None (Preliminary result)   Collection Time: 10/30/18  9:06 PM  Result Value Ref Range Status   Specimen  Description BLOOD BLOOD RIGHT HAND  Final   Special Requests   Final    BOTTLES DRAWN AEROBIC AND ANAEROBIC Blood Culture adequate volume   Culture   Final    NO GROWTH 4 DAYS Performed at [facility], [address], [place], [place] [address]    Report Status PENDING  Incomplete  CULTURE, BLOOD (ROUTINE X 2) w Reflex to ID Panel     Status: None (Preliminary result)   Collection Time: 10/30/18  9:13 PM  Result Value Ref Range Status   Specimen Description BLOOD BLOOD LEFT FOREARM  Final   Special Requests   Final    BOTTLES DRAWN AEROBIC AND ANAEROBIC Blood Culture adequate volume   Culture   Final    NO GROWTH 4 DAYS Performed at [facility], [address] [place], [place] [address]    Report Status PENDING  Incomplete  MRSA PCR Screening     Status: None   Collection Time: 10/31/18 11:43 AM  Result Value Ref Range Status   MRSA by PCR NEGATIVE NEGATIVE Final  Comment:        The GeneXpert MRSA Assay (FDA approved for NASAL specimens only), is one component of a comprehensive MRSA colonization surveillance program. It is not intended to diagnose MRSA infection nor to guide or monitor treatment for MRSA infections. Performed at [facility], [address] [place], [place] [address]      Studies: No results found.  Scheduled Meds: .  morphine injection  1 mg Intravenous Q6H   Continuous Infusions:   Assessment/Plan:   1. Agitation and weakness.  He was on Depakote and Seroquel.  Haldol as needed.  2. Acute cystitis.  He was treated with Rocephin, urine culture report DIPHTHEROIDS.  Per Dr. Timmie Foerster, possible due to contamination. 3. Alzheimer's dementia with agitation, continue Seroquel and Depakote.  Continue Aricept and Namenda.  Behavioral medicine consult suggested advanced dementia, continue current treatment. 4. Lower extremity edema.  Place TED hose.  Ultrasound the left lower extremity negative for DVT. 5. History  of congestive heart failure.  Unknown type, stable.  Sepsis due to aspiration pneumonia. Antibiotics was changed to meropenem and vancomycin.  Acute respiratory failure with hypoxia due to aspiration pneumonia.  He was treated with NRB oxygen, NEB PRN. Lactic acidosis.  Improved with IV fluid support. Acute renal failure due to dehydration.  He was treated with IV fluid. The patient has very poor prognosis. The patient is on comfort care. Discussed with his daughter and son about hospice facility placement if the patient survive over the weekend. Code Status:     Code Status Orders  (From admission, onward)         Start     Ordered   10/26/18 0917  Do not attempt resuscitation (DNR)  Continuous    Question Answer Comment  In the event of cardiac or respiratory ARREST Do not call a "code blue"   In the event of cardiac or respiratory ARREST Do not perform Intubation, CPR, defibrillation or ACLS   In the event of cardiac or respiratory ARREST Use medication by any route, position, wound care, and other measures to relive pain and suffering. May use oxygen, suction and manual treatment of airway obstruction as needed for comfort.      10/26/18 4982        Code Status History    Date Active Date Inactive Code Status Order ID Comments User Context   10/26/2018 0424 10/26/2018 0917 Full Code [id number]  Harrie Foreman, MD Inpatient    Advance Directive Documentation     Most Recent Value  Type of Advance Directive  Healthcare Power of Attorney  Pre-existing out of facility DNR order (yellow form or pink MOST form)  -  "MOST" Form in Place?  -     Family Communication: Discussed with his daughter and son about hospice care. Disposition Plan: Possible discharge to nursing facility in ? days.  Antibiotics:  Rocephin  Time spent: 32 minutes  [place]

## 2018-11-04 LAB — CULTURE, BLOOD (ROUTINE X 2)
Culture: NO GROWTH
Culture: NO GROWTH
Special Requests: ADEQUATE
Special Requests: ADEQUATE

## 2018-11-04 LAB — NOVEL CORONAVIRUS, NAA (HOSP ORDER, SEND-OUT TO REF LAB; TAT 18-24 HRS): SARS-CoV-2, NAA: NOT DETECTED

## 2018-11-04 NOTE — Progress Notes (Signed)
Patient ID: Jaime Liner Sr., male   DOB: 05-06-1936, 83 y.o.   MRN: [id number]  [facility] PROGRESS NOTE  Jaime Villegas VZD:[id number] DOB: Oct 08, 1935 DOA: 10/25/2018 PCP: Kirk Ruths, MD  HPI/Subjective: Patient is awake, opened eyes, no distress. Objective: Vitals:   11/03/18 2046 11/04/18 0432  BP: (!) 159/104 (!) 137/91  Pulse: (!) 102 100  Resp: 16 17  Temp: 98.4 F (36.9 C) (!) 97.4 F (36.3 C)  SpO2: 94% 96%    Intake/Output Summary (Last 24 hours) at 11/04/2018 1129 Last data filed at 11/04/2018 0544 Gross per 24 hour  Intake -  Output 500 ml  Net -500 ml   Filed Weights   11/01/18 0348 11/02/18 0420 11/03/18 2951  Weight: 57.7 kg 55 kg 53 kg    ROS: Review of Systems  Unable to perform ROS: Dementia   Exam: Physical Exam  Eyes: Pupils are equal, round, and reactive to light. Conjunctivae and lids are normal.  Neck: Carotid bruit is not present.  Cardiovascular: Regular rhythm, S1 normal and S2 normal.  Murmur heard.  Systolic murmur is present with a grade of 2/6. Respiratory: He has no decreased breath sounds. He has no wheezes. He has no rhonchi. He has no rales.  GI: Soft. Bowel sounds are normal. He exhibits no distension.  Musculoskeletal:     Right ankle: He exhibits no swelling.  Neurological:  Awake, demented and confused.  Skin: Skin is warm. No rash noted. Nails show no clubbing.      Data Reviewed: Basic Metabolic Panel: Recent Labs  Lab 10/31/18 0047 11/01/18 0500  NA 139 144  K 3.9 4.0  CL 107 116*  CO2 21* 22  GLUCOSE 166* 130*  BUN 42* 44*  CREATININE 1.33* 1.09  CALCIUM 9.1 8.4*   Liver Function Tests: No results for input(s): AST, ALT, ALKPHOS, BILITOT, PROT, ALBUMIN in the last 168 hours. CBC: Recent Labs  Lab 10/31/18 0047 11/01/18 0500  WBC 12.5* 6.7  HGB 13.0 11.6*  HCT 38.5* 35.4*  MCV 95.8 98.3  PLT 185 125*   Cardiac Enzymes: Recent Labs  Lab 10/31/18 0222  CKTOTAL 44*      Recent Results (from the past 240 hour(s))  Urine culture     Status: Abnormal   Collection Time: 10/25/18 11:09 PM  Result Value Ref Range Status   Specimen Description   Final    URINE, RANDOM Performed at [facility], [address] [place], [place] [address]    Special Requests   Final    NONE Performed at [facility], [address] [place], [place] [address]    Culture (A)  Final    >=100,000 COLONIES/mL DIPHTHEROIDS(CORYNEBACTERIUM SPECIES) Standardized susceptibility testing for this organism is not available. Performed at [facility], [place] [address], [place], [place] [address]    Report Status 10/27/2018 FINAL  Final  SARS Coronavirus 2 (CEPHEID- Performed in [place] hospital lab), Hosp Order     Status: None   Collection Time: 10/26/18  1:40 AM  Result Value Ref Range Status   SARS Coronavirus 2 NEGATIVE NEGATIVE Final    Comment: (NOTE) If result is NEGATIVE SARS-CoV-2 target nucleic acids are NOT DETECTED. The SARS-CoV-2 RNA is generally detectable in upper and lower  respiratory specimens during the acute phase of infection. The lowest  concentration of SARS-CoV-2 viral copies this assay can detect is 250  copies / mL. A negative result does not preclude SARS-CoV-2 infection  and should not be used as the sole basis for treatment or other  patient management decisions.  A negative result may occur with  improper specimen collection / handling, submission of specimen other  than nasopharyngeal swab, presence of viral mutation(s) within the  areas targeted by this assay, and inadequate number of viral copies  (<250 copies / mL). A negative result must be combined with clinical  observations, patient history, and epidemiological information. If result is POSITIVE SARS-CoV-2 target nucleic acids are DETECTED. The SARS-CoV-2 RNA is generally detectable in upper and lower  respiratory specimens dur ing the acute phase of  infection.  Positive  results are indicative of active infection with SARS-CoV-2.  Clinical  correlation with patient history and other diagnostic information is  necessary to determine patient infection status.  Positive results do  not rule out bacterial infection or co-infection with other viruses. If result is PRESUMPTIVE POSTIVE SARS-CoV-2 nucleic acids MAY BE PRESENT.   A presumptive positive result was obtained on the submitted specimen  and confirmed on repeat testing.  While 2019 novel coronavirus  (SARS-CoV-2) nucleic acids may be present in the submitted sample  additional confirmatory testing may be necessary for epidemiological  and / or clinical management purposes  to differentiate between  SARS-CoV-2 and other Sarbecovirus currently known to infect humans.  If clinically indicated additional testing with an alternate test  methodology [phone]) is advised. The SARS-CoV-2 RNA is generally  detectable in upper and lower respiratory sp ecimens during the acute  phase of infection. The expected result is Negative. Fact Sheet for Patients:  StrictlyIdeas.no Fact Sheet for Healthcare Providers: BankingDealers.co.za This test is not yet approved or cleared by the [place] FDA and has been authorized for detection and/or diagnosis of SARS-CoV-2 by FDA under an Emergency Use Authorization (EUA).  This EUA will remain in effect (meaning this test can be used) for the duration of the COVID-19 declaration under Section 564(b)(1) of the Act, 21 U.S.C. section 360bbb-3(b)(1), unless the authorization is terminated or revoked sooner. Performed at [facility], [address] [place], [place] [address]   CULTURE, BLOOD (ROUTINE X 2) w Reflex to ID Panel     Status: None   Collection Time: 10/30/18  9:06 PM  Result Value Ref Range Status   Specimen Description BLOOD BLOOD RIGHT HAND  Final   Special Requests   Final     BOTTLES DRAWN AEROBIC AND ANAEROBIC Blood Culture adequate volume   Culture   Final    NO GROWTH 5 DAYS Performed at [facility], [address] [place], [place] [address]    Report Status 11/04/2018 FINAL  Final  CULTURE, BLOOD (ROUTINE X 2) w Reflex to ID Panel     Status: None   Collection Time: 10/30/18  9:13 PM  Result Value Ref Range Status   Specimen Description BLOOD BLOOD LEFT FOREARM  Final   Special Requests   Final    BOTTLES DRAWN AEROBIC AND ANAEROBIC Blood Culture adequate volume   Culture   Final    NO GROWTH 5 DAYS Performed at [facility], [address], [place], [place] [address]    Report Status 11/04/2018 FINAL  Final  MRSA PCR Screening     Status: None   Collection Time: 10/31/18 11:43 AM  Result Value Ref Range Status   MRSA by PCR NEGATIVE NEGATIVE Final    Comment:        The GeneXpert MRSA Assay (FDA approved for  NASAL specimens only), is one component of a comprehensive MRSA colonization surveillance program. It is not intended to diagnose MRSA infection nor to guide or monitor treatment for MRSA infections. Performed at [facility], [address] [place], [place] [address]      Studies: No results found.  Scheduled Meds: .  morphine injection  1 mg Intravenous Q6H   Continuous Infusions:   Assessment/Plan:   1. Agitation and weakness.  He was on Depakote and Seroquel.  Haldol as needed.  2. Acute cystitis.  He was treated with Rocephin, urine culture report DIPHTHEROIDS.  Per Dr. Timmie Foerster, possible due to contamination. 3. Alzheimer's dementia with agitation, continue Seroquel and Depakote.  Continue Aricept and Namenda.  Behavioral medicine consult suggested advanced dementia, continue current treatment. 4. Lower extremity edema.  Place TED hose.  Ultrasound the left lower extremity negative for DVT. 5. History of congestive heart failure.  Unknown type, stable.  Sepsis due to aspiration  pneumonia. Antibiotics was changed to meropenem and vancomycin.  Acute respiratory failure with hypoxia due to aspiration pneumonia.  He was treated with NRB oxygen, NEB PRN. Lactic acidosis.  Improved with IV fluid support. Acute renal failure due to dehydration.  He was treated with IV fluid. The patient has very poor prognosis.  Continue comfort care.  Possible hospice facility placement this coming week. Discussed with his daughter and son about hospice facility placement if the patient survive over the weekend. Code Status:     Code Status Orders  (From admission, onward)         Start     Ordered   10/26/18 0917  Do not attempt resuscitation (DNR)  Continuous    Question Answer Comment  In the event of cardiac or respiratory ARREST Do not call a "code blue"   In the event of cardiac or respiratory ARREST Do not perform Intubation, CPR, defibrillation or ACLS   In the event of cardiac or respiratory ARREST Use medication by any route, position, wound care, and other measures to relive pain and suffering. May use oxygen, suction and manual treatment of airway obstruction as needed for comfort.      10/26/18 5277        Code Status History    Date Active Date Inactive Code Status Order ID Comments User Context   10/26/2018 0424 10/26/2018 0917 Full Code [id number]  Harrie Foreman, MD Inpatient    Advance Directive Documentation     Most Recent Value  Type of Advance Directive  Healthcare Power of Attorney  Pre-existing out of facility DNR order (yellow form or pink MOST form)  -  "MOST" Form in Place?  -     Family Communication: Discussed with his daughter and son about hospice care. Disposition Plan: Possible discharge to hospice facility in 2 days.  Antibiotics:  Rocephin  Time spent: 20 minutes  [place]

## 2018-11-05 DIAGNOSIS — Z515 Encounter for palliative care: Secondary | ICD-10-CM

## 2018-11-05 NOTE — Progress Notes (Signed)
Daily Progress Note   Patient Name: Jaime PLATNER Sr.       Date: 11/05/2018 DOB: 1936/03/23  Age: 83 y.o. MRN#: [id number] Attending Physician: Demetrios Loll, MD Primary Care Physician: Kirk Ruths, MD Admit Date: 10/25/2018  Reason for Consultation/Follow-up: Establishing goals of care and Terminal Care  Subjective: Patient resting in bed. He is opening his eyes to look at his family. He moves extremities. No distress noted at this time. He is not eating, and is drinking sips per family. Plans for hospice home when able. Family is aware.    Length of Stay: 8  Current Medications: Scheduled Meds:  .  morphine injection  1 mg Intravenous Q6H    Continuous Infusions:   PRN Meds: acetaminophen **OR** acetaminophen, glycopyrrolate, haloperidol lactate, LORazepam, morphine injection, ondansetron **OR** ondansetron (ZOFRAN) IV    Vital Signs: BP 127/89 (BP Location: Right Arm)   Pulse (!) 108   Temp 99.4 F (37.4 C) (Axillary)   Resp 14   Ht 5\' 8"  (1.727 m)   Wt 53 kg   SpO2 96%   BMI 17.77 kg/m  SpO2: SpO2: (PT wouldn't allow Tech to get Sp02) O2 Device: O2 Device: Room Air O2 Flow Rate: O2 Flow Rate (L/min): 15 L/min  Intake/output summary:   Intake/Output Summary (Last 24 hours) at 11/05/2018 1243 Last data filed at 11/05/2018 1030 Gross per 24 hour  Intake -  Output 800 ml  Net -800 ml   LBM: Last BM Date: 10/28/18 Baseline Weight: Weight: 77.1 kg Most recent weight: Weight: 53 kg       Palliative Assessment/Data: PPS 10%    Flowsheet Rows     Most Recent Value  Intake Tab  Referral Department  Hospitalist  Unit at Time of Referral  Med/Surg Unit  Palliative Care Primary Diagnosis  Neurology  Date Notified  10/29/18  Palliative Care Type  New Palliative care   Reason for referral  Clarify Goals of Care  Date of Admission  10/25/18  Date first seen by Palliative Care  10/30/18  # of days Palliative referral response time  1 Day(s)  # of days IP prior to Palliative referral  4  Clinical Assessment  Palliative Performance Scale Score  10%  Psychosocial & Spiritual Assessment  Palliative Care Outcomes  Patient/Family meeting held?  Yes  Who was at the meeting?  wife, daughter, son, DIL  Palliative Care Outcomes  Clarified goals of care, Provided end of life care assistance, Provided psychosocial or spiritual support, ACP counseling assistance, Improved pain interventions, Improved non-pain symptom therapy, Counseled regarding hospice, Changed to focus on comfort      Patient Active Problem List   Diagnosis Date Noted  . SOB (shortness of breath)   . Aspiration pneumonia of both lungs ([place])   . Palliative care by specialist   . Terminal care   . UTI (urinary tract infection) 10/28/2018  . Weakness 10/26/2018  . Dementia ([place]) 09/24/2018  . Right inguinal hernia 06/20/2014    Palliative Care Assessment & Plan   HPI: 83 y.o.malewith past medical history of Alzheimer's dementia, CHF, CKDadmitted on 5/14/2020with agitation from home.Family reports patient has not slept in 3 days and became increasingly  aggressive and difficult to manage. In ED, urinalysis consistent with UTI and given ceftriazone x1. Required safety sitter this admission due to agitation. Psych evaluation with plan to continue home dose medications. Patient receiving Seroquel, Depakote, Aricept, and Namenda. Requires higher level of care and family requesting SNF placement. Palliative medicine consultation for goals of care.  Assessment: Follow up for symptom check and terminal care.   Recommendations/Plan:  Comfort measures only  Continue morphine, haldol, ativan, and robinul for symptom management   Goals of Care and Additional Recommendations:  Limitations on  Scope of Treatment: Full Comfort Care  Code Status:  DNR  Prognosis:   Hours - Days  Discharge Planning:  Hospice facility    Thank you for allowing the Palliative Medicine Team to assist in the care of this patient.   Total Time 35 minutes Prolonged Time Billed  no         Greater than 50%  of this time was spent counseling and coordinating care related to the above assessment and plan.

## 2018-11-05 NOTE — Care Management Important Message (Signed)
Important Message  Patient Details  Name: Jaime LUSSIER Sr. MRN: [id number] Date of Birth: [birth year]   Medicare Important Message Given:  Yes  Reviewed verbally over phone with daughter, Almond Lint at [phone].  Daughter has copy of Medicare IM sent previously to her attention.    Dannette Barbara 11/05/2018, 3:10 PM

## 2018-11-05 NOTE — Progress Notes (Signed)
Patient ID: Jaime Liner Sr., male   DOB: 08/16/1935, 83 y.o.   MRN: [id number]  [facility] PROGRESS NOTE  Jaime Villegas OFB:[id number] DOB: Oct 17, 1935 DOA: 10/25/2018 PCP: Kirk Ruths, MD  HPI/Subjective: Patient is awake, opened eyes, no distress. Objective: Vitals:   11/04/18 0432 11/05/18 0438  BP: (!) 137/91 127/89  Pulse: 100 (!) 108  Resp: 17 14  Temp: (!) 97.4 F (36.3 C) 99.4 F (37.4 C)  SpO2: 96%     Intake/Output Summary (Last 24 hours) at 11/05/2018 1341 Last data filed at 11/05/2018 1030 Gross per 24 hour  Intake -  Output 800 ml  Net -800 ml   Filed Weights   11/01/18 0348 11/02/18 0420 11/03/18 7824  Weight: 57.7 kg 55 kg 53 kg    ROS: Review of Systems  Unable to perform ROS: Dementia   Exam: Physical Exam  Eyes: Pupils are equal, round, and reactive to light. Conjunctivae and lids are normal.  Neck: Carotid bruit is not present.  Cardiovascular: Regular rhythm, S1 normal and S2 normal.  Murmur heard.  Systolic murmur is present with a grade of 2/6. Respiratory: He has no decreased breath sounds. He has no wheezes. He has no rhonchi. He has no rales.  GI: Soft. Bowel sounds are normal. He exhibits no distension.  Musculoskeletal:     Right ankle: He exhibits no swelling.  Neurological:  Awake, demented and confused.  Skin: Skin is warm. No rash noted. Nails show no clubbing.      Data Reviewed: Basic Metabolic Panel: Recent Labs  Lab 10/31/18 0047 11/01/18 0500  NA 139 144  K 3.9 4.0  CL 107 116*  CO2 21* 22  GLUCOSE 166* 130*  BUN 42* 44*  CREATININE 1.33* 1.09  CALCIUM 9.1 8.4*   Liver Function Tests: No results for input(s): AST, ALT, ALKPHOS, BILITOT, PROT, ALBUMIN in the last 168 hours. CBC: Recent Labs  Lab 10/31/18 0047 11/01/18 0500  WBC 12.5* 6.7  HGB 13.0 11.6*  HCT 38.5* 35.4*  MCV 95.8 98.3  PLT 185 125*   Cardiac Enzymes: Recent Labs  Lab 10/31/18 0222  CKTOTAL 44*     Recent  Results (from the past 240 hour(s))  CULTURE, BLOOD (ROUTINE X 2) w Reflex to ID Panel     Status: None   Collection Time: 10/30/18  9:06 PM  Result Value Ref Range Status   Specimen Description BLOOD BLOOD RIGHT HAND  Final   Special Requests   Final    BOTTLES DRAWN AEROBIC AND ANAEROBIC Blood Culture adequate volume   Culture   Final    NO GROWTH 5 DAYS Performed at [facility], [address] [place], [place] [address]    Report Status 11/04/2018 FINAL  Final  CULTURE, BLOOD (ROUTINE X 2) w Reflex to ID Panel     Status: None   Collection Time: 10/30/18  9:13 PM  Result Value Ref Range Status   Specimen Description BLOOD BLOOD LEFT FOREARM  Final   Special Requests   Final    BOTTLES DRAWN AEROBIC AND ANAEROBIC Blood Culture adequate volume   Culture   Final    NO GROWTH 5 DAYS Performed at [facility], [address], [place], [place] [address]    Report Status 11/04/2018 FINAL  Final  MRSA PCR Screening     Status: None   Collection Time: 10/31/18 11:43 AM  Result Value Ref Range Status   MRSA by PCR NEGATIVE NEGATIVE  Final    Comment:        The GeneXpert MRSA Assay (FDA approved for NASAL specimens only), is one component of a comprehensive MRSA colonization surveillance program. It is not intended to diagnose MRSA infection nor to guide or monitor treatment for MRSA infections. Performed at [facility], [address] [place], [place] [address]   Novel Coronavirus, NAA (hospital order; send-out to ref lab)     Status: None   Collection Time: 11/02/18  8:12 PM  Result Value Ref Range Status   SARS-CoV-2, NAA NOT DETECTED NOT DETECTED Final    Comment: (NOTE) Testing was performed using the cobas(R) SARS-CoV-2 test. This test was developed and its performance characteristics determined by [employer]. This test has not been FDA cleared or approved. This test has been authorized by FDA under an Emergency Use  Authorization (EUA). This test is only authorized for the duration of time the declaration that circumstances exist justifying the authorization of the emergency use of in vitro diagnostic tests for detection of SARS-CoV-2 virus and/or diagnosis of COVID-19 infection under section 564(b)(1) of the Act, 21 U.S.C. 528UXL-2(G)(4), unless the authorization is terminated or revoked sooner. When diagnostic testing is negative, the possibility of a false negative result should be considered in the context of a patient's recent exposures and the presence of clinical signs and symptoms consistent with COVID-19. An individual without symptoms of COVID-19 and who is not shedding SARS-CoV-2 virus would expect to have  a negative (not detected) result in this assay. Performed At: [facility] [place], [place] [id number] Rush Farmer MD UY:[id number]    [place]  Final    Comment: Performed at [facility], [address] [place], [place] [address]     Studies: No results found.  Scheduled Meds: .  morphine injection  1 mg Intravenous Q6H   Continuous Infusions:   Assessment/Plan:   1. Agitation and weakness.  He was on Depakote and Seroquel.  Haldol as needed.  2. Acute cystitis.  He was treated with Rocephin, urine culture report DIPHTHEROIDS.  Per Dr. Timmie Foerster, possible due to contamination. 3. Alzheimer's dementia with agitation, continue Seroquel and Depakote.  Continue Aricept and Namenda.  Behavioral medicine consult suggested advanced dementia, continue current treatment. 4. Lower extremity edema.  Place TED hose.  Ultrasound the left lower extremity negative for DVT. 5. History of congestive heart failure.  Unknown type, stable.  Sepsis due to aspiration pneumonia. Antibiotics was changed to meropenem and vancomycin.  Acute respiratory failure with hypoxia due to aspiration pneumonia.  He was treated with NRB oxygen,  NEB PRN. Lactic acidosis.  Improved with IV fluid support. Acute renal failure due to dehydration.  He was treated with IV fluid. The patient has very poor prognosis.  Continue comfort care.  I discussed with his daughter about hospice facility placement tomorrow. Code Status:     Code Status Orders  (From admission, onward)         Start     Ordered   10/26/18 0917  Do not attempt resuscitation (DNR)  Continuous    Question Answer Comment  In the event of cardiac or respiratory ARREST Do not call a "code blue"   In the event of cardiac or respiratory ARREST Do not perform Intubation, CPR, defibrillation or ACLS   In the event of cardiac or respiratory ARREST Use medication by any route, position, wound care, and other measures to relive pain and suffering. May  use oxygen, suction and manual treatment of airway obstruction as needed for comfort.      10/26/18 5498        Code Status History    Date Active Date Inactive Code Status Order ID Comments User Context   10/26/2018 0424 10/26/2018 0917 Full Code [id number]  Harrie Foreman, MD Inpatient    Advance Directive Documentation     Most Recent Value  Type of Advance Directive  Healthcare Power of Attorney  Pre-existing out of facility DNR order (yellow form or pink MOST form)  -  "MOST" Form in Place?  -     Family Communication: Discussed with his daughter and son about hospice care. Disposition Plan: Possible discharge to hospice facility in 1 days.  Antibiotics:  Rocephin  Time spent: 20 minutes  [place]

## 2018-11-06 NOTE — Progress Notes (Addendum)
Daily Progress Note   Patient Name: Jaime WEAKLAND Sr.       Date: 11/06/2018 DOB: January 14, 1936  Age: 83 y.o. MRN#: [id number] Attending Physician: Bettey Costa, MD Primary Care Physician: Kirk Ruths, MD Admit Date: 10/25/2018  Reason for Consultation/Follow-up: Establishing goals of care and Terminal Care  Subjective: Patient resting in bed. No family at bedside. No distress noted. Respirations are even and unlabored.  He opens his eyes to touch. He does not speak.    Spoke with daughter Judeen Hammans, she is overwhelmed as she had plans to go out of town this weekend. She has been updated on plans. All questions answered.    Length of Stay: 9  Current Medications: Scheduled Meds:  .  morphine injection  1 mg Intravenous Q6H    Continuous Infusions:   PRN Meds: acetaminophen **OR** acetaminophen, glycopyrrolate, haloperidol lactate, LORazepam, morphine injection, ondansetron **OR** ondansetron (ZOFRAN) IV    Vital Signs: BP (!) 141/79 (BP Location: Right Arm)   Pulse (!) 105   Temp 98.1 F (36.7 C) (Axillary)   Resp 19   Ht 5\' 8"  (1.727 m)   Wt 53 kg   SpO2 100%   BMI 17.77 kg/m  SpO2: SpO2: 100 % O2 Device: O2 Device: Room Air O2 Flow Rate: O2 Flow Rate (L/min): 15 L/min  Intake/output summary:   Intake/Output Summary (Last 24 hours) at 11/06/2018 1053 Last data filed at 11/06/2018 0744 Gross per 24 hour  Intake -  Output 250 ml  Net -250 ml   LBM: Last BM Date: 11/05/18 Baseline Weight: Weight: 77.1 kg Most recent weight: Weight: 53 kg       Palliative Assessment/Data: PPS 10%    Flowsheet Rows     Most Recent Value  Intake Tab  Referral Department  Hospitalist  Unit at Time of Referral  Med/Surg Unit  Palliative Care Primary Diagnosis  Neurology  Date  Notified  10/29/18  Palliative Care Type  New Palliative care  Reason for referral  Clarify Goals of Care  Date of Admission  10/25/18  Date first seen by Palliative Care  10/30/18  # of days Palliative referral response time  1 Day(s)  # of days IP prior to Palliative referral  4  Clinical Assessment  Palliative Performance Scale Score  10%  Psychosocial & Spiritual Assessment  Palliative Care Outcomes  Patient/Family meeting held?  Yes  Who was at the meeting?  wife, daughter, son, DIL  Palliative Care Outcomes  Clarified goals of care, Provided end of life care assistance, Provided psychosocial or spiritual support, ACP counseling assistance, Improved pain interventions, Improved non-pain symptom therapy, Counseled regarding hospice, Changed to focus on comfort      Patient Active Problem List   Diagnosis Date Noted  . SOB (shortness of breath)   . Aspiration pneumonia of both lungs ([place])   . Palliative care by specialist   . Terminal care   . UTI (urinary tract infection) 10/28/2018  . Weakness 10/26/2018  . Dementia ([place]) 09/24/2018  . Right inguinal hernia 06/20/2014    Palliative Care Assessment & Plan   HPI: 83 y.o.malewith past medical history of Alzheimer's dementia, CHF, CKDadmitted on 5/14/2020with agitation from home.Family  reports patient has not slept in 3 days and became increasingly aggressive and difficult to manage. In ED, urinalysis consistent with UTI and given ceftriazone x1. Required safety sitter this admission due to agitation. Psych evaluation with plan to continue home dose medications. Patient receiving Seroquel, Depakote, Aricept, and Namenda. Requires higher level of care and family requesting SNF placement. Palliative medicine consultation for goals of care.  Assessment: Follow up for symptom check and terminal care.   Recommendations/Plan:  Comfort measures only  Continue morphine, haldol, ativan, and robinul for symptom management    Goals of Care and Additional Recommendations:  Limitations on Scope of Treatment: Full Comfort Care  Code Status:  DNR  Prognosis:   Hours - Days  Discharge Planning:  Hospice facility    Thank you for allowing the Palliative Medicine Team to assist in the care of this patient.   Total Time 15 minutes Prolonged Time Billed  no         Greater than 50%  of this time was spent counseling and coordinating care related to the above assessment and plan.

## 2018-11-06 NOTE — TOC Progression Note (Signed)
Transition of Care (TOC) - Progression Note    Patient Details  Name: Jaime MCPARTLAND Sr. MRN: [id number] Date of Birth: 1936/02/03  Transition of Care [facility]) CM/SW [place], [place] Phone Number: 11/06/2018, 8:03 PM  Clinical Narrative: CSW received consult for residential hospice for patient. CSW spoke with patient's daughter Judeen Hammans regarding hospice. Daughter reports that they would like patient transferred to hospice home in [facility] if he is stable enough. CSW notified Santiago Glad with [place] hosp[facility] of referral. CSW will continue to follow for discharge planning       Expected Discharge Plan: Long Term Nursing Home Barriers to Discharge: Continued Medical Work up  Expected Discharge Plan and Services Expected Discharge Plan: [place]       Living arrangements for the past 2 months: Single Family Home Expected Discharge Date: 10/30/18                                     Social Determinants of Health (SDOH) Interventions    Readmission Risk Interventions Readmission Risk Prevention Plan 10/26/2018  Transportation Screening Complete  Some recent data might be hidden

## 2018-11-06 NOTE — Progress Notes (Signed)
New hospice home referral received from [place], of note patient was a referral last week for outpatient Palliative, but declined at the end of the week and made comfort care. Family has chosen to continued comfort focused care at the hospice home. Writer spke via telephone to patient's daughter Jolyn Lent, message left for patient's wife prior to call to [facility]. Education initiated regarding hospice home services, philosophy and team approach to care with understanding voiced. Family is aware that there is currently no bed available for transfer today, hospital staff also made aware. Plan is for transfer via EMS tomorrow if patient remains stable. Hospital care team updated, patient information faxed to referral. Patient seen lying in bed, appeared to be sleeping, he is currently receiving scheduled 1 mg  IV morphine q 6 hrs with a PRN dose q 1 hr and IV lorazepam 0.5 mg q 4 hrs PRN, he has received both today. He is not eating or drinking. Will continue to follow. Thank you. Flo Shanks BSN, RN, [facility] Liaison [phone]

## 2018-11-06 NOTE — Progress Notes (Signed)
[facility] [place] NAME: Jaime Villegas    MR#:  [id number]  DATE OF BIRTH:  [birth year]  SUBJECTIVE:   patient on comfort care  REVIEW OF SYSTEMS:    Unable to obtain Comfort care  Tolerating Diet: NPO      DRUG ALLERGIES:   Allergies  Allergen Reactions  . Aspirin   . Bee Pollen     Bee stings  . Darvon [Propoxyphene] Other (See Comments)    hallucinations  . Epinephrine Other (See Comments)    hallucinate  . Penicillins Swelling  . Sulfa Antibiotics Swelling  . Xylocaine [Lidocaine Hcl] Other (See Comments)    hallucinate    VITALS:  Blood pressure (!) 141/79, pulse (!) 105, temperature 98.1 F (36.7 C), temperature source Axillary, resp. rate 19, height 5\' 8"  (1.727 m), weight 53 kg, SpO2 100 %.  PHYSICAL EXAMINATION:  Constitutional:critically ill appearing not labored breathing    LABORATORY PANEL:   CBC Recent Labs  Lab 11/01/18 0500  WBC 6.7  HGB 11.6*  HCT 35.4*  PLT 125*   ------------------------------------------------------------------------------------------------------------------  Chemistries  Recent Labs  Lab 11/01/18 0500  NA 144  K 4.0  CL 116*  CO2 22  GLUCOSE 130*  BUN 44*  CREATININE 1.09  CALCIUM 8.4*   ------------------------------------------------------------------------------------------------------------------  Cardiac Enzymes No results for input(s): TROPONINI in the last 168 hours. ------------------------------------------------------------------------------------------------------------------  RADIOLOGY:  No results found.   ASSESSMENT AND PLAN:   83 year old male with history of dementia and chronic kidney disease admitted due to agitation.   1.  Alzheimer's dementia 2.  Agitation 3.  Acute cystitis   Patient on comfort care time spent 9 minutes  POSSIBLE D/C ??, DEPENDING ON CLINICAL CONDITION.   Bettey Costa M.D on 11/06/2018 at 2:14  PM  Between 7am to 6pm - Pager - [phone] After 6pm go to [email or web address] - password [email or web address] [facility]  Office  [phone]  CC: Primary care physician; Kirk Ruths, MD  Note: This dictation was prepared with Dragon dictation along with smaller phrase technology. Any transcriptional errors that result from this process are unintentional.

## 2018-11-07 MED ORDER — MORPHINE SULFATE 10 MG/5ML PO SOLN: 5.0000 mg | ORAL | 0 refills | Status: AC | PRN

## 2018-11-07 NOTE — TOC Transition Note (Signed)
Transition of Care [facility]) - CM/SW Discharge Note   Patient Details  Name: JHAYDEN DEMURO Sr. MRN: [id number] Date of Birth: [birth year]  Transition of Care [facility]) CM/SW Contact:  Annamaria Boots, [place] Phone Number: 11/07/2018, 10:16 AM   Clinical Narrative:  Patient is medically ready for discharge today to [facility] home. Patient's family is aware and in agreement. Patient will be transported by EMS. Santiago Glad, hospice liaison will call report and call for transport.      Final next level of care: [place] Barriers to Discharge: No Barriers Identified   Patient Goals and CMS Choice        Discharge Placement                    Patient and family notified of of transfer: 11/07/18  Discharge Plan and Services                                     Social Determinants of Health (SDOH) Interventions     Readmission Risk Interventions Readmission Risk Prevention Plan 10/26/2018  Transportation Screening Complete  Some recent data might be hidden

## 2018-11-07 NOTE — Discharge Summary (Signed)
[place] [place] NAME: Jaime Villegas    MR#:  [id number]  DATE OF BIRTH:  1935/09/08  DATE OF ADMISSION:  10/25/2018 ADMITTING PHYSICIAN: Harrie Foreman, MD  DATE OF DISCHARGE: 11/07/2018  PRIMARY CARE PHYSICIAN: Kirk Ruths, MD    ADMISSION DIAGNOSIS:  Insomnia, unspecified type [G47.00] Localized swelling of left foot [R22.42]  DISCHARGE DIAGNOSIS:  Dementia agitation  SECONDARY DIAGNOSIS:   Past Medical History:  Diagnosis Date  . Allergy   . Back problem   . Cancer ([place]) 2006, 2013   nose, left ear  . CHF (congestive heart failure) ([place])   . Chronic kidney disease   . Dementia ([place])   . Joint ache     HOSPITAL COURSE:   82 year old male with history of dementia and chronic kidney disease admitted due to agitation. Patient with significant decline and family decided on comfort care and hospice. He is being discharged to  Hospice facility  1.  Alzheimer's dementia 2.  Agitation 3.  Acute cystitis   Palliative care consult greatly appreciated during this admissions   DISCHARGE CONDITIONS AND DIET:   Guarded poor prognosis <1 week  CONSULTS OBTAINED:    DRUG ALLERGIES:   Allergies  Allergen Reactions  . Aspirin   . Bee Pollen     Bee stings  . Darvon [Propoxyphene] Other (See Comments)    hallucinations  . Epinephrine Other (See Comments)    hallucinate  . Penicillins Swelling  . Sulfa Antibiotics Swelling  . Xylocaine [Lidocaine Hcl] Other (See Comments)    hallucinate    DISCHARGE MEDICATIONS:   Allergies as of 11/07/2018      Reactions   Aspirin    Bee Pollen    Bee stings   Darvon [propoxyphene] Other (See Comments)   hallucinations   Epinephrine Other (See Comments)   hallucinate   Penicillins Swelling   Sulfa Antibiotics Swelling   Xylocaine [lidocaine Hcl] Other (See Comments)   hallucinate      Medication List    STOP taking these medications   aspirin EC 81  MG tablet   cetirizine 10 MG tablet Commonly known as:  ZYRTEC   cholecalciferol 25 MCG (1000 UT) tablet Commonly known as:  VITAMIN D3   divalproex 125 MG DR tablet Commonly known as:  DEPAKOTE   donepezil 10 MG tablet Commonly known as:  ARICEPT   gabapentin 100 MG capsule Commonly known as:  NEURONTIN   gabapentin 400 MG capsule Commonly known as:  NEURONTIN   ibuprofen 200 MG tablet Commonly known as:  ADVIL   memantine 10 MG tablet Commonly known as:  NAMENDA   QUEtiapine 25 MG tablet Commonly known as:  SEROQUEL   traMADol 50 MG tablet Commonly known as:  ULTRAM   traZODone 50 MG tablet Commonly known as:  DESYREL     TAKE these medications   metoprolol tartrate 25 MG tablet Commonly known as:  LOPRESSOR Take 0.5 tablets (12.5 mg total) by mouth 2 (two) times daily.   morphine 10 MG/5ML solution Take 2.5 mLs (5 mg total) by mouth every 2 (two) hours as needed for moderate pain or severe pain (sob).         Today   CHIEF COMPLAINT:  Going to hospice home today   VITAL SIGNS:  Blood pressure 123/87, pulse (!) 120, temperature 99.5 F (37.5 C), temperature source Oral, resp. rate (!) 21, height 5\' 8"  (1.727 m), weight 53 kg, SpO2 94 %.  REVIEW OF SYSTEMS:  Review of Systems  Unable to perform ROS: Acuity of condition  Neurological: Positive for loss of consciousness.     PHYSICAL EXAMINATION:  GENERAL:  83 y.o.-year-old patient lying in the bed with no acute distress.  No labored breathing   DATA REVIEW:   CBC Recent Labs  Lab 11/01/18 0500  WBC 6.7  HGB 11.6*  HCT 35.4*  PLT 125*    Chemistries  Recent Labs  Lab 11/01/18 0500  NA 144  K 4.0  CL 116*  CO2 22  GLUCOSE 130*  BUN 44*  CREATININE 1.09  CALCIUM 8.4*    Cardiac Enzymes No results for input(s): TROPONINI in the last 168 hours.  Microbiology Results  @MICRORSLT48 @  RADIOLOGY:  No results found.    Allergies as of 11/07/2018      Reactions    Aspirin    Bee Pollen    Bee stings   Darvon [propoxyphene] Other (See Comments)   hallucinations   Epinephrine Other (See Comments)   hallucinate   Penicillins Swelling   Sulfa Antibiotics Swelling   Xylocaine [lidocaine Hcl] Other (See Comments)   hallucinate      Medication List    STOP taking these medications   aspirin EC 81 MG tablet   cetirizine 10 MG tablet Commonly known as:  ZYRTEC   cholecalciferol 25 MCG (1000 UT) tablet Commonly known as:  VITAMIN D3   divalproex 125 MG DR tablet Commonly known as:  DEPAKOTE   donepezil 10 MG tablet Commonly known as:  ARICEPT   gabapentin 100 MG capsule Commonly known as:  NEURONTIN   gabapentin 400 MG capsule Commonly known as:  NEURONTIN   ibuprofen 200 MG tablet Commonly known as:  ADVIL   memantine 10 MG tablet Commonly known as:  NAMENDA   QUEtiapine 25 MG tablet Commonly known as:  SEROQUEL   traMADol 50 MG tablet Commonly known as:  ULTRAM   traZODone 50 MG tablet Commonly known as:  DESYREL     TAKE these medications   metoprolol tartrate 25 MG tablet Commonly known as:  LOPRESSOR Take 0.5 tablets (12.5 mg total) by mouth 2 (two) times daily.   morphine 10 MG/5ML solution Take 2.5 mLs (5 mg total) by mouth every 2 (two) hours as needed for moderate pain or severe pain (sob).        Stable for discharge hospice home  Patient should follow up with hospice  CODE STATUS:     Code Status Orders  (From admission, onward)         Start     Ordered   10/26/18 0917  Do not attempt resuscitation (DNR)  Continuous    Question Answer Comment  In the event of cardiac or respiratory ARREST Do not call a "code blue"   In the event of cardiac or respiratory ARREST Do not perform Intubation, CPR, defibrillation or ACLS   In the event of cardiac or respiratory ARREST Use medication by any route, position, wound care, and other measures to relive pain and suffering. May use oxygen, suction and manual  treatment of airway obstruction as needed for comfort.      10/26/18 0917        Code Status History    Date Active Date Inactive Code Status Order ID Comments User Context   10/26/2018 0424 10/26/2018 0917 Full Code [id number]  Harrie Foreman, MD Inpatient    Advance Directive Documentation     Most Recent Value  Type of Advance Directive  Healthcare Power of Attorney  Pre-existing out of facility DNR order (yellow form or pink MOST form)  -  "MOST" Form in Place?  -      TOTAL TIME TAKING CARE OF THIS PATIENT: 35 minutes.    Note: This dictation was prepared with Dragon dictation along with smaller phrase technology. Any transcriptional errors that result from this process are unintentional.  Bettey Costa M.D on 11/07/2018 at 8:30 AM  Between 7am to 6pm - Pager - [phone] After 6pm go to [email or web address] - password [email or web address] [facility]  Office  [phone]  CC: Primary care physician; Kirk Ruths, MD

## 2018-11-07 NOTE — Progress Notes (Signed)
Follow up visit made to new hospice home referral. Patient seen lying in bed, appeared lethargic, did open eyes to voice, not following commands. Son and granddaughter at bedside, end of life education provided as well as emotional support. Family felt patient was having pain and felt warm, staff RN Danielle notified and addressed concerns. PRN morphine and tylenol suppository given. Plan is for transport to the hospice home today via EMS when consents are signed. Hospital care team updated. Discharge summary faxed to referral. Thank you. Flo Shanks BSN, RN, [facility] [phone]

## 2018-11-07 NOTE — Progress Notes (Signed)
Daily Progress Note   Patient Name: Jaime ALA Sr.       Date: 11/07/2018 DOB: 1935/08/25  Age: 83 y.o. MRN#: [id number] Attending Physician: Bettey Costa, MD Primary Care Physician: Kirk Ruths, MD Admit Date: 10/25/2018  Reason for Consultation/Follow-up: Establishing goals of care and Terminal Care  Subjective: Patient resting in bed. No distress noted. Even and unlabored respirations. No family at bedside, they have recently stepped out. Plans for transfer to hospice facility today.    Length of Stay: 10  Current Medications: Scheduled Meds:  .  morphine injection  1 mg Intravenous Q6H    Continuous Infusions:   PRN Meds: acetaminophen **OR** acetaminophen, glycopyrrolate, haloperidol lactate, LORazepam, morphine injection, ondansetron **OR** ondansetron (ZOFRAN) IV    Vital Signs: BP 126/67 (BP Location: Right Arm)   Pulse (!) 109   Temp (!) 101.1 F (38.4 C) (Axillary)   Resp 16   Ht 5\' 8"  (1.727 m)   Wt 53 kg   SpO2 93%   BMI 17.77 kg/m  SpO2: SpO2: 93 % O2 Device: O2 Device: Room Air O2 Flow Rate: O2 Flow Rate (L/min): 15 L/min  Intake/output summary:   Intake/Output Summary (Last 24 hours) at 11/07/2018 1210 Last data filed at 11/07/2018 1202 Gross per 24 hour  Intake -  Output 400 ml  Net -400 ml   LBM: Last BM Date: 11/07/18 Baseline Weight: Weight: 77.1 kg Most recent weight: Weight: 53 kg       Palliative Assessment/Data: PPS 10%    Flowsheet Rows     Most Recent Value  Intake Tab  Referral Department  Hospitalist  Unit at Time of Referral  Med/Surg Unit  Palliative Care Primary Diagnosis  Neurology  Date Notified  10/29/18  Palliative Care Type  New Palliative care  Reason for referral  Clarify Goals of Care  Date of Admission  10/25/18   Date first seen by Palliative Care  10/30/18  # of days Palliative referral response time  1 Day(s)  # of days IP prior to Palliative referral  4  Clinical Assessment  Palliative Performance Scale Score  10%  Psychosocial & Spiritual Assessment  Palliative Care Outcomes  Patient/Family meeting held?  Yes  Who was at the meeting?  wife, daughter, son, DIL  Palliative Care Outcomes  Clarified goals of care, Provided end of life care assistance, Provided psychosocial or spiritual support, ACP counseling assistance, Improved pain interventions, Improved non-pain symptom therapy, Counseled regarding hospice, Changed to focus on comfort      Patient Active Problem List   Diagnosis Date Noted  . SOB (shortness of breath)   . Aspiration pneumonia of both lungs ([place])   . Palliative care by specialist   . Terminal care   . UTI (urinary tract infection) 10/28/2018  . Weakness 10/26/2018  . Dementia ([place]) 09/24/2018  . Right inguinal hernia 06/20/2014    Palliative Care Assessment & Plan   HPI: 83 y.o.malewith past medical history of Alzheimer's dementia, CHF, CKDadmitted on 5/14/2020with agitation from home.Family reports patient has not slept in 3 days and became increasingly aggressive and difficult to manage. In ED, urinalysis consistent with UTI and given ceftriazone x1. Required safety sitter this  admission due to agitation. Psych evaluation with plan to continue home dose medications. Patient receiving Seroquel, Depakote, Aricept, and Namenda. Requires higher level of care and family requesting SNF placement. Palliative medicine consultation for goals of care.  Assessment: Follow up for symptom check and terminal care.   Recommendations/Plan:  Comfort measures only.Plans for transfer to hospice facility today.   Continue morphine, haldol, ativan, and robinul for symptom management   Goals of Care and Additional Recommendations:  Limitations on Scope of Treatment: Full  Comfort Care  Code Status:  DNR  Prognosis:   Hours - Days  Discharge Planning:  Hospice facility    Thank you for allowing the Palliative Medicine Team to assist in the care of this patient.   Total Time 15 minutes Prolonged Time Billed  no         Greater than 50%  of this time was spent counseling and coordinating care related to the above assessment and plan.

## 2018-11-12 DEATH — deceased

## 2020-08-06 IMAGING — US VENOUS DOPPLER ULTRASOUND OF LEFT LOWER EXTREMITY
1 series · 13 of 24 positions shown · non-contrast
Comparison: None.

CLINICAL DATA: Left leg swelling



[Series 1: venous doppler ultrasound of left lower extremity · 0.05mm/px · 13 of 35 slices shown]
[im 1/35]
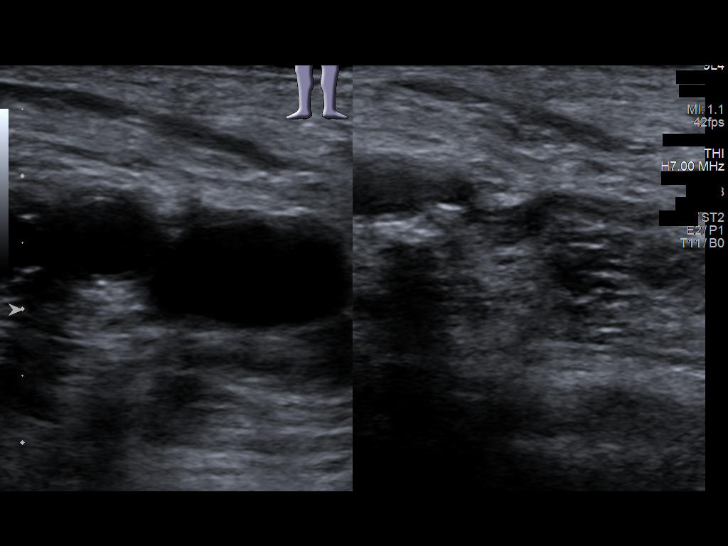
[im 3/35]
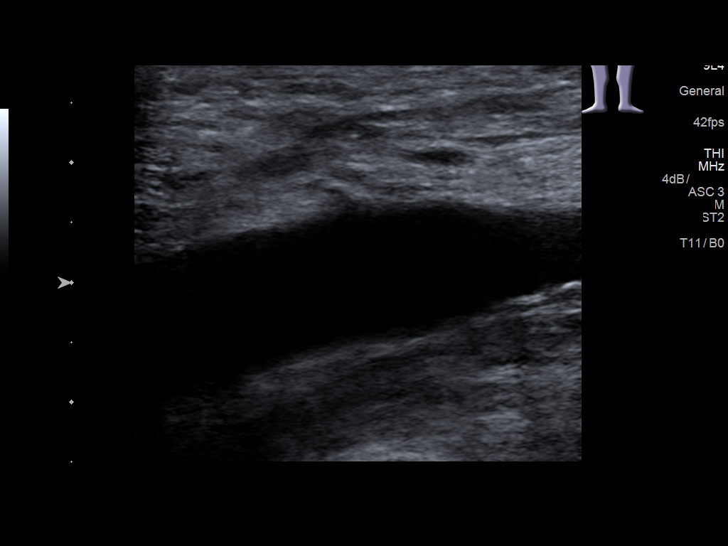
[im 6/35]
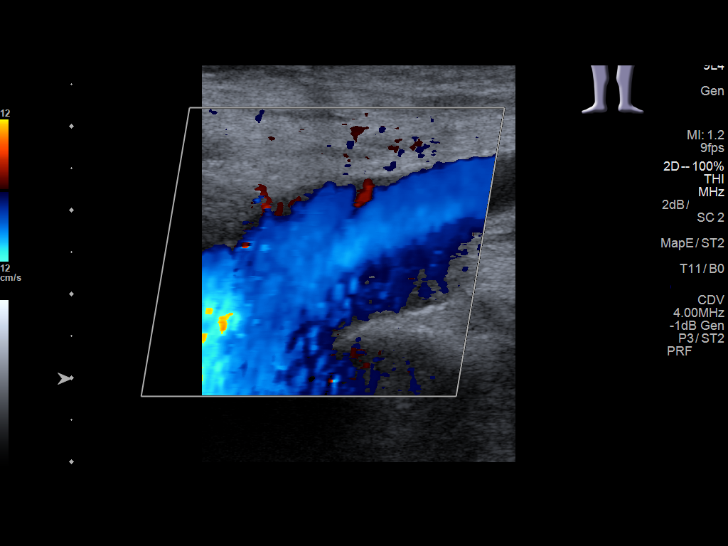
[im 9/35]
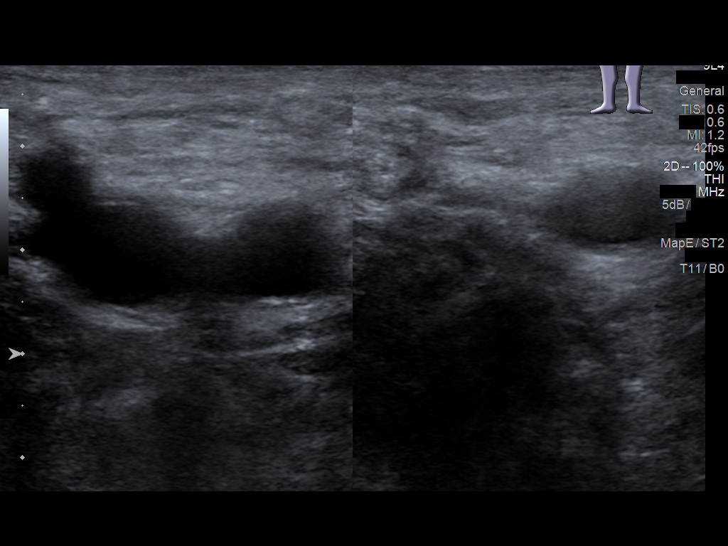
[im 12/35]
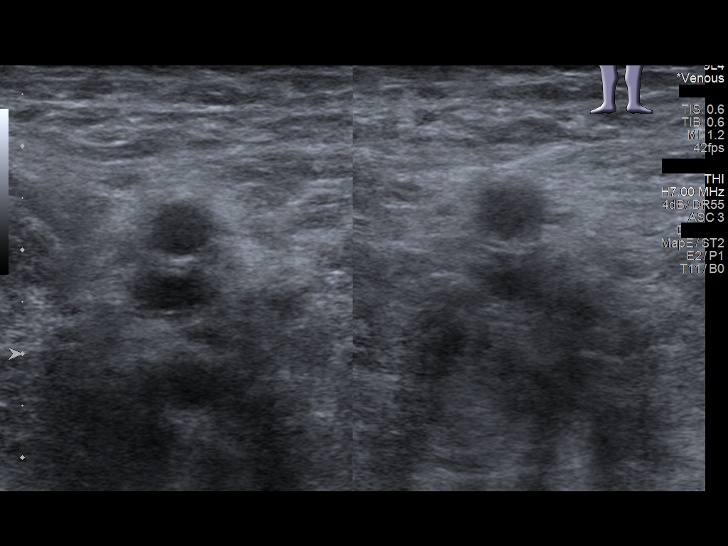
[im 15/35]
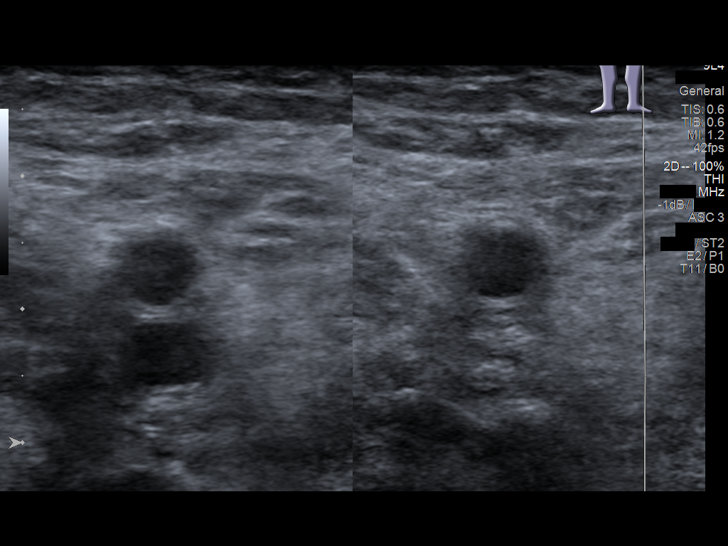
[im 18/35]
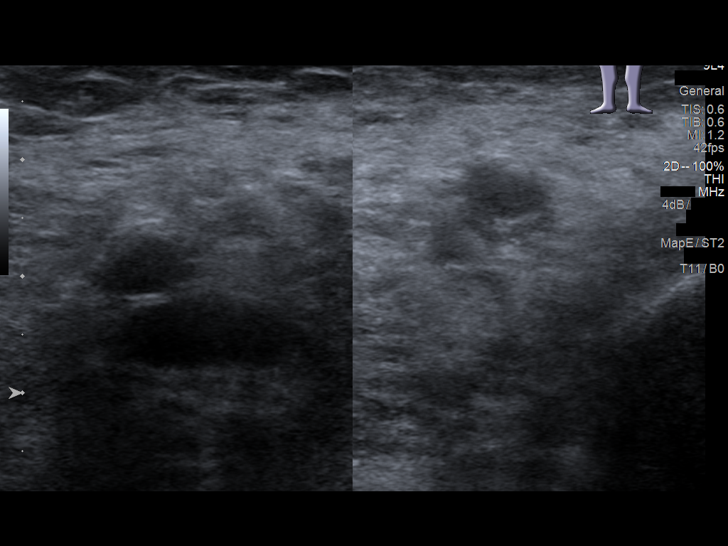
[im 20/35]
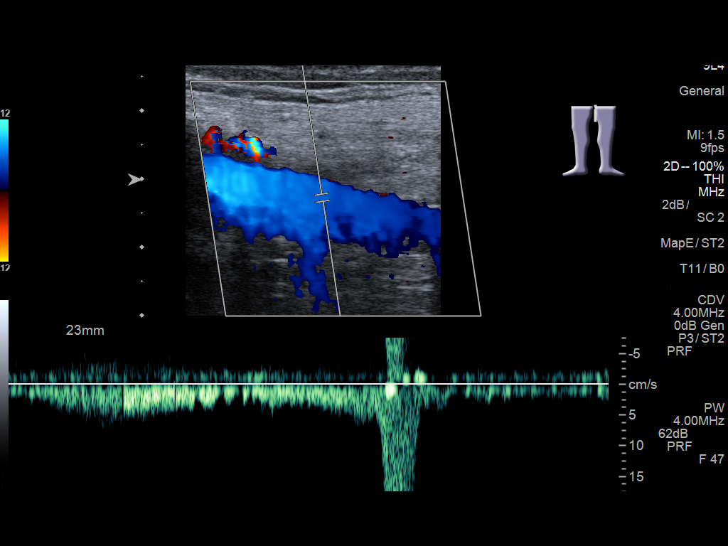
[im 23/35]
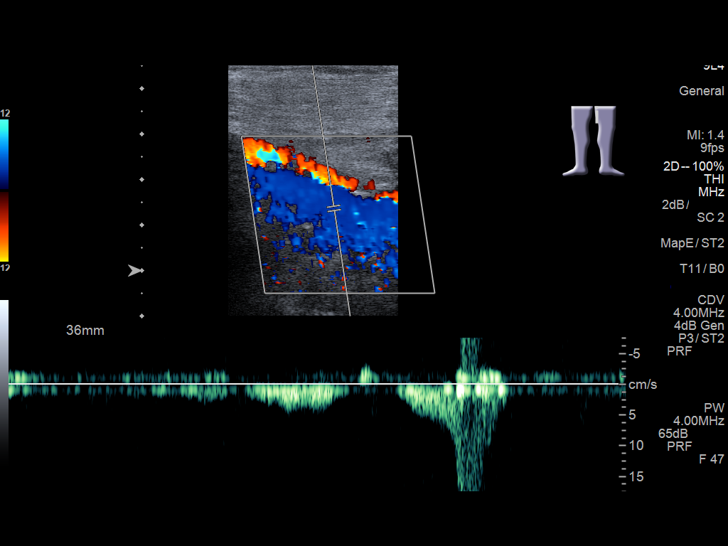
[im 26/35]
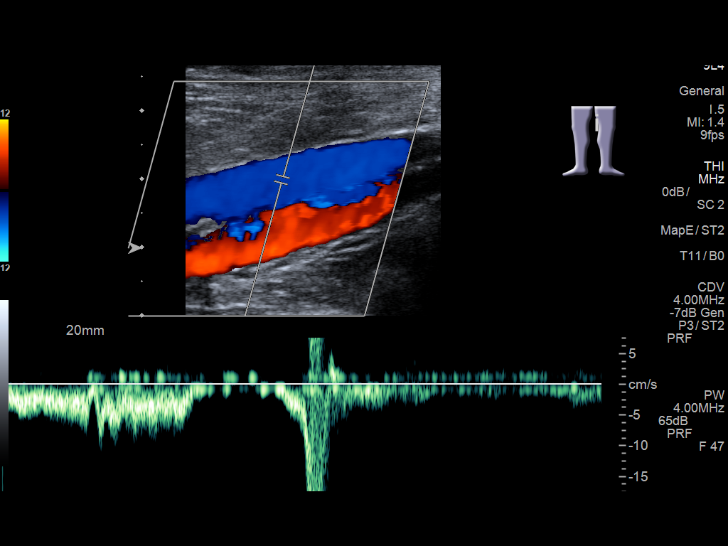
[im 29/35]
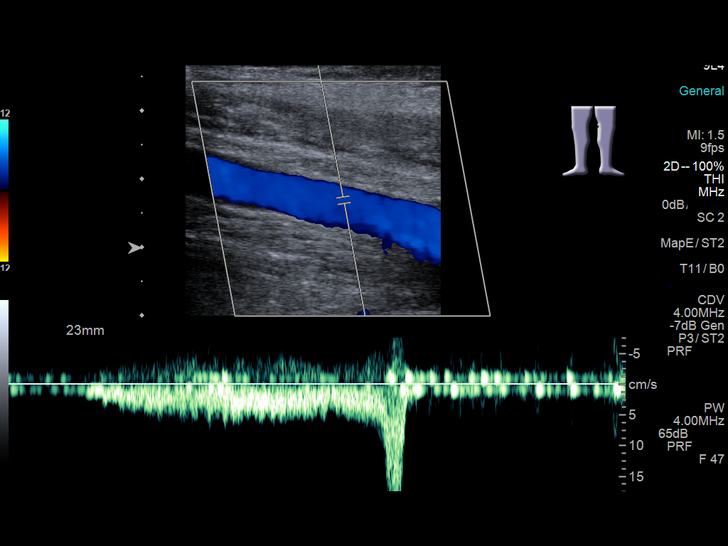
[im 32/35]
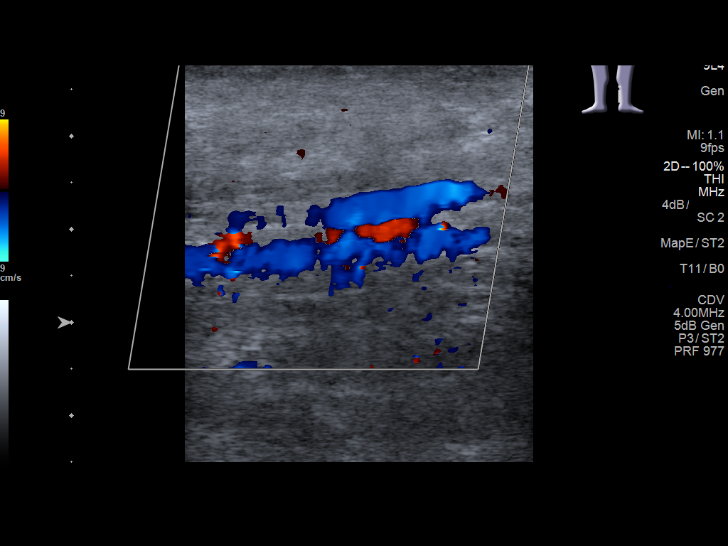
[im 35/35]
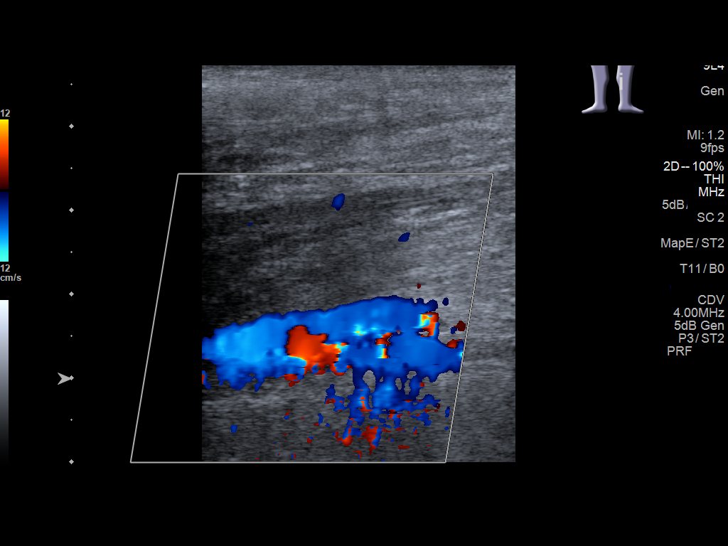

[13 of 24 positions shown; findings below may reference images not displayed]

FINDINGS: Contralateral Common Femoral Vein: Respiratory phasicity is normal
and symmetric with the symptomatic side. No evidence of thrombus.
Normal compressibility.

Common Femoral Vein: No evidence of thrombus. Normal
compressibility, respiratory phasicity and response to augmentation.

Saphenofemoral Junction: No evidence of thrombus. Normal
compressibility and flow on color Doppler imaging.

Profunda Femoral Vein: No evidence of thrombus. Normal
compressibility and flow on color Doppler imaging.

Femoral Vein: No evidence of thrombus. Normal compressibility,
respiratory phasicity and response to augmentation.

Popliteal Vein: No evidence of thrombus. Normal compressibility,
respiratory phasicity and response to augmentation.

Calf Veins: No evidence of thrombus. Normal compressibility and flow
on color Doppler imaging.

Superficial Great Saphenous Vein: No evidence of thrombus. Normal
compressibility.

Venous Reflux:  None.

Other Findings:  None.
IMPRESSION: No evidence of deep venous thrombosis.
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
